# Patient Record
Sex: Male | Born: 1960 | Race: Black or African American | Hispanic: No | Marital: Single
Health system: Southern US, Community
[De-identification: ages and names within clinical notes are randomized; demographics above are authoritative.]

---

## 2005-12-25 ENCOUNTER — Other Ambulatory Visit: Payer: Self-pay

## 2005-12-25 ENCOUNTER — Emergency Department: Payer: Self-pay | Admitting: Emergency Medicine

## 2012-02-21 ENCOUNTER — Emergency Department: Payer: Self-pay | Admitting: Emergency Medicine

## 2012-02-21 LAB — URINALYSIS, COMPLETE
Bilirubin,UR: NEGATIVE
Blood: NEGATIVE
Nitrite: NEGATIVE
RBC,UR: <1 /HPF (ref 0–5)
Specific Gravity: 1.004 (ref 1.003–1.030)
WBC UR: 1 /HPF (ref 0–5)

## 2012-02-21 LAB — COMPREHENSIVE METABOLIC PANEL
Alkaline Phosphatase: 124 U/L (ref 50–136)
BUN: 7 mg/dL (ref 7–18)
Chloride: 106 mmol/L (ref 98–107)
Creatinine: 0.77 mg/dL (ref 0.60–1.30)
EGFR (African American): >60
EGFR (Non-African Amer.): >60
Glucose: 94 mg/dL (ref 65–99)
Osmolality: 275 (ref 275–301)
Sodium: 139 mmol/L (ref 136–145)

## 2012-02-21 LAB — DRUG SCREEN, URINE
Amphetamines, Ur Screen: NEGATIVE (ref ?–1000)
Cannabinoid 50 Ng, Ur ~~LOC~~: NEGATIVE (ref ?–50)
Cocaine Metabolite,Ur ~~LOC~~: POSITIVE (ref ?–300)
Methadone, Ur Screen: NEGATIVE (ref ?–300)
Tricyclic, Ur Screen: NEGATIVE (ref ?–1000)

## 2012-02-21 LAB — TSH: Thyroid Stimulating Horm: 1.23 u[IU]/mL

## 2012-02-21 LAB — TROPONIN I: Troponin-I: <0.02 ng/mL

## 2012-02-21 LAB — CBC
HCT: 37.5 % — ABNORMAL LOW (ref 40.0–52.0)
HGB: 12.9 g/dL — ABNORMAL LOW (ref 13.0–18.0)
MCH: 35.6 pg — ABNORMAL HIGH (ref 26.0–34.0)
MCHC: 34.5 g/dL (ref 32.0–36.0)
Platelet: 179 10*3/uL (ref 150–440)
RDW: 13.7 % (ref 11.5–14.5)
WBC: 9.3 10*3/uL (ref 3.8–10.6)

## 2012-02-21 LAB — LIPASE, BLOOD: Lipase: 228 U/L (ref 73–393)

## 2012-02-21 LAB — ETHANOL
Ethanol %: 0.062 % (ref 0.000–0.080)
Ethanol: 62 mg/dL

## 2012-03-22 ENCOUNTER — Emergency Department: Payer: Self-pay | Admitting: Emergency Medicine

## 2012-03-22 LAB — TROPONIN I: Troponin-I: <0.02 ng/mL

## 2012-03-22 LAB — COMPREHENSIVE METABOLIC PANEL
Alkaline Phosphatase: 101 U/L (ref 50–136)
Bilirubin,Total: 0.7 mg/dL (ref 0.2–1.0)
Calcium, Total: 8.7 mg/dL (ref 8.5–10.1)
EGFR (African American): >60
Glucose: 95 mg/dL (ref 65–99)
Potassium: 4 mmol/L (ref 3.5–5.1)
SGOT(AST): 30 U/L (ref 15–37)
SGPT (ALT): 26 U/L (ref 12–78)
Sodium: 135 mmol/L — ABNORMAL LOW (ref 136–145)
Total Protein: 8.1 g/dL (ref 6.4–8.2)

## 2012-03-22 LAB — CBC
Platelet: 186 10*3/uL (ref 150–440)
WBC: 15.5 10*3/uL — ABNORMAL HIGH (ref 3.8–10.6)

## 2012-05-09 ENCOUNTER — Emergency Department: Payer: Self-pay | Admitting: Emergency Medicine

## 2012-05-09 LAB — COMPREHENSIVE METABOLIC PANEL
Alkaline Phosphatase: 93 U/L (ref 50–136)
Anion Gap: 4 — ABNORMAL LOW (ref 7–16)
Bilirubin,Total: 0.5 mg/dL (ref 0.2–1.0)
EGFR (African American): >60
EGFR (Non-African Amer.): >60
Glucose: 86 mg/dL (ref 65–99)
Osmolality: 271 (ref 275–301)
SGOT(AST): 66 U/L — ABNORMAL HIGH (ref 15–37)
SGPT (ALT): 56 U/L (ref 12–78)
Sodium: 137 mmol/L (ref 136–145)
Total Protein: 7.9 g/dL (ref 6.4–8.2)

## 2012-05-09 LAB — URINALYSIS, COMPLETE
Bilirubin,UR: NEGATIVE
Ketone: NEGATIVE
Leukocyte Esterase: NEGATIVE
Nitrite: NEGATIVE
Ph: 5 (ref 4.5–8.0)
RBC,UR: <1 /HPF (ref 0–5)
Squamous Epithelial: NONE SEEN
WBC UR: 1 /HPF (ref 0–5)

## 2012-05-09 LAB — DRUG SCREEN, URINE
Amphetamines, Ur Screen: NEGATIVE (ref ?–1000)
Methadone, Ur Screen: NEGATIVE (ref ?–300)
Opiate, Ur Screen: NEGATIVE (ref ?–300)
Tricyclic, Ur Screen: NEGATIVE (ref ?–1000)

## 2012-05-09 LAB — CBC
MCHC: 32.7 g/dL (ref 32.0–36.0)
MCV: 103 fL — ABNORMAL HIGH (ref 80–100)
Platelet: 206 10*3/uL (ref 150–440)
RBC: 3.88 10*6/uL — ABNORMAL LOW (ref 4.40–5.90)
RDW: 16.9 % — ABNORMAL HIGH (ref 11.5–14.5)
WBC: 9 10*3/uL (ref 3.8–10.6)

## 2012-05-09 LAB — ETHANOL: Ethanol: 5 mg/dL

## 2012-05-15 ENCOUNTER — Ambulatory Visit: Payer: Self-pay | Admitting: Internal Medicine

## 2012-05-29 ENCOUNTER — Inpatient Hospital Stay: Payer: Self-pay | Admitting: Internal Medicine

## 2012-05-29 LAB — CBC WITH DIFFERENTIAL/PLATELET
Basophil %: 0.8 %
Eosinophil #: 0.3 10*3/uL (ref 0.0–0.7)
Eosinophil %: 3.6 %
HCT: 37.8 % — ABNORMAL LOW (ref 40.0–52.0)
HGB: 12.6 g/dL — ABNORMAL LOW (ref 13.0–18.0)
Lymphocyte %: 17.8 %
Monocyte %: 9.9 %

## 2012-05-29 LAB — COMPREHENSIVE METABOLIC PANEL
Albumin: 3.4 g/dL (ref 3.4–5.0)
Alkaline Phosphatase: 91 U/L (ref 50–136)
BUN: 6 mg/dL — ABNORMAL LOW (ref 7–18)
Calcium, Total: 8.5 mg/dL (ref 8.5–10.1)
Co2: 30 mmol/L (ref 21–32)
Glucose: 88 mg/dL (ref 65–99)
Osmolality: 278 (ref 275–301)
Potassium: 4.3 mmol/L (ref 3.5–5.1)
SGPT (ALT): 23 U/L (ref 12–78)

## 2012-05-31 ENCOUNTER — Ambulatory Visit: Payer: Self-pay | Admitting: Internal Medicine

## 2012-06-01 LAB — PATHOLOGY REPORT

## 2012-06-07 ENCOUNTER — Ambulatory Visit: Payer: Self-pay | Admitting: Internal Medicine

## 2012-06-07 LAB — PROTIME-INR: Prothrombin Time: 14.5 secs (ref 11.5–14.7)

## 2012-06-07 LAB — PLATELET COUNT: Platelet: 289 10*3/uL (ref 150–440)

## 2012-06-07 LAB — APTT: Activated PTT: 37.7 secs — ABNORMAL HIGH (ref 23.6–35.9)

## 2012-06-13 ENCOUNTER — Ambulatory Visit: Payer: Self-pay | Admitting: Vascular Surgery

## 2012-06-14 ENCOUNTER — Ambulatory Visit: Payer: Self-pay | Admitting: Internal Medicine

## 2012-06-14 LAB — PATHOLOGY REPORT

## 2012-06-18 LAB — COMPREHENSIVE METABOLIC PANEL
Anion Gap: 13 (ref 7–16)
Bilirubin,Total: 0.5 mg/dL (ref 0.2–1.0)
Calcium, Total: 8.8 mg/dL (ref 8.5–10.1)
Co2: 24 mmol/L (ref 21–32)
EGFR (African American): >60
Glucose: 81 mg/dL (ref 65–99)
Osmolality: 280 (ref 275–301)
SGOT(AST): 29 U/L (ref 15–37)
SGPT (ALT): 21 U/L (ref 12–78)

## 2012-06-18 LAB — CBC CANCER CENTER
Basophil #: 0 x10 3/mm (ref 0.0–0.1)
Basophil %: 0.5 %
Eosinophil %: 1.5 %
Lymphocyte #: 1.7 x10 3/mm (ref 1.0–3.6)
Lymphocyte %: 17.4 %
MCHC: 32.9 g/dL (ref 32.0–36.0)
MCV: 100 fL (ref 80–100)
Monocyte %: 9.8 %
Neutrophil %: 70.8 %
Platelet: 208 x10 3/mm (ref 150–440)
RBC: 3.91 10*6/uL — ABNORMAL LOW (ref 4.40–5.90)
RDW: 15.4 % — ABNORMAL HIGH (ref 11.5–14.5)
WBC: 9.5 x10 3/mm (ref 3.8–10.6)

## 2012-06-19 LAB — CEA: CEA: 8.6 ng/mL — ABNORMAL HIGH (ref 0.0–4.7)

## 2012-06-19 LAB — CANCER ANTIGEN 19-9: CA 19-9: 21 U/mL (ref 0–35)

## 2012-06-25 LAB — BASIC METABOLIC PANEL
Anion Gap: 9 (ref 7–16)
BUN: 8 mg/dL (ref 7–18)
Co2: 27 mmol/L (ref 21–32)
Glucose: 100 mg/dL — ABNORMAL HIGH (ref 65–99)
Potassium: 3.9 mmol/L (ref 3.5–5.1)

## 2012-06-25 LAB — CBC CANCER CENTER
Basophil #: 0 x10 3/mm (ref 0.0–0.1)
Basophil %: 1.2 %
Eosinophil %: 2.4 %
HCT: 34.5 % — ABNORMAL LOW (ref 40.0–52.0)
HGB: 11.6 g/dL — ABNORMAL LOW (ref 13.0–18.0)
MCH: 33.6 pg (ref 26.0–34.0)
MCHC: 33.8 g/dL (ref 32.0–36.0)
MCV: 100 fL (ref 80–100)
Monocyte %: 2.3 %
Neutrophil %: 57.7 %
RBC: 3.47 10*6/uL — ABNORMAL LOW (ref 4.40–5.90)
RDW: 14.1 % (ref 11.5–14.5)
WBC: 3.7 x10 3/mm — ABNORMAL LOW (ref 3.8–10.6)

## 2012-07-10 LAB — CBC CANCER CENTER
Basophil %: 0.6 %
Eosinophil #: 0 x10 3/mm (ref 0.0–0.7)
Eosinophil %: 0 %
HCT: 32.8 % — ABNORMAL LOW (ref 40.0–52.0)
Lymphocyte #: 1.9 x10 3/mm (ref 1.0–3.6)
MCH: 32.8 pg (ref 26.0–34.0)
MCHC: 33.3 g/dL (ref 32.0–36.0)
Monocyte #: 1.9 x10 3/mm — ABNORMAL HIGH (ref 0.2–1.0)
Monocyte %: 11.3 %
Neutrophil #: 13.1 x10 3/mm — ABNORMAL HIGH (ref 1.4–6.5)
RBC: 3.33 10*6/uL — ABNORMAL LOW (ref 4.40–5.90)
RDW: 13.4 % (ref 11.5–14.5)
WBC: 16.9 x10 3/mm — ABNORMAL HIGH (ref 3.8–10.6)

## 2012-07-10 LAB — COMPREHENSIVE METABOLIC PANEL
Albumin: 2.6 g/dL — ABNORMAL LOW (ref 3.4–5.0)
Anion Gap: 10 (ref 7–16)
BUN: 11 mg/dL (ref 7–18)
Bilirubin,Total: 0.8 mg/dL (ref 0.2–1.0)
Calcium, Total: 8.6 mg/dL (ref 8.5–10.1)
Co2: 26 mmol/L (ref 21–32)
Creatinine: 1.1 mg/dL (ref 0.60–1.30)
Potassium: 4.3 mmol/L (ref 3.5–5.1)
SGOT(AST): 17 U/L (ref 15–37)
SGPT (ALT): 16 U/L (ref 12–78)
Sodium: 132 mmol/L — ABNORMAL LOW (ref 136–145)
Total Protein: 7.7 g/dL (ref 6.4–8.2)

## 2012-07-15 ENCOUNTER — Ambulatory Visit: Payer: Self-pay | Admitting: Internal Medicine

## 2012-07-15 LAB — CULTURE, BLOOD (SINGLE)

## 2012-07-17 ENCOUNTER — Inpatient Hospital Stay: Payer: Self-pay | Admitting: Internal Medicine

## 2012-07-17 LAB — CBC CANCER CENTER
Basophil #: 0.1 x10 3/mm (ref 0.0–0.1)
Basophil %: 0.4 %
HCT: 33.1 % — ABNORMAL LOW (ref 40.0–52.0)
HGB: 11.2 g/dL — ABNORMAL LOW (ref 13.0–18.0)
Lymphocyte %: 10.9 %
MCH: 32.8 pg (ref 26.0–34.0)
MCHC: 33.8 g/dL (ref 32.0–36.0)
MCV: 97 fL (ref 80–100)
Monocyte #: 2.1 x10 3/mm — ABNORMAL HIGH (ref 0.2–1.0)
Monocyte %: 10.2 %
Neutrophil #: 15.8 x10 3/mm — ABNORMAL HIGH (ref 1.4–6.5)
Platelet: 419 x10 3/mm (ref 150–440)
RBC: 3.41 10*6/uL — ABNORMAL LOW (ref 4.40–5.90)
WBC: 20.2 x10 3/mm — ABNORMAL HIGH (ref 3.8–10.6)

## 2012-07-17 LAB — BASIC METABOLIC PANEL
Anion Gap: 13 (ref 7–16)
Calcium, Total: 9 mg/dL (ref 8.5–10.1)
Chloride: 97 mmol/L — ABNORMAL LOW (ref 98–107)
Co2: 25 mmol/L (ref 21–32)
Creatinine: 1.24 mg/dL (ref 0.60–1.30)
EGFR (African American): >60
Osmolality: 269 (ref 275–301)
Sodium: 135 mmol/L — ABNORMAL LOW (ref 136–145)

## 2012-07-18 LAB — CBC WITH DIFFERENTIAL/PLATELET
Basophil #: 0.3 10*3/uL — ABNORMAL HIGH (ref 0.0–0.1)
Basophil %: 1.3 %
Eosinophil #: 0 10*3/uL (ref 0.0–0.7)
HCT: 28.6 % — ABNORMAL LOW (ref 40.0–52.0)
Lymphocyte #: 2.2 10*3/uL (ref 1.0–3.6)
Lymphocyte %: 9.9 %
MCHC: 33.7 g/dL (ref 32.0–36.0)
Monocyte #: 2 x10 3/mm — ABNORMAL HIGH (ref 0.2–1.0)
Monocyte %: 9.4 %
Neutrophil #: 17.2 10*3/uL — ABNORMAL HIGH (ref 1.4–6.5)
Platelet: 361 10*3/uL (ref 150–440)
RDW: 13.3 % (ref 11.5–14.5)

## 2012-07-18 LAB — BASIC METABOLIC PANEL
Chloride: 99 mmol/L (ref 98–107)
Co2: 24 mmol/L (ref 21–32)
Creatinine: 0.8 mg/dL (ref 0.60–1.30)
EGFR (African American): >60
EGFR (Non-African Amer.): >60
Glucose: 78 mg/dL (ref 65–99)
Potassium: 3.9 mmol/L (ref 3.5–5.1)

## 2012-07-19 LAB — MAGNESIUM: Magnesium: 1.4 mg/dL — ABNORMAL LOW

## 2012-07-19 LAB — CBC WITH DIFFERENTIAL/PLATELET
Eosinophil %: 0.1 %
HCT: 27 % — ABNORMAL LOW (ref 40.0–52.0)
HGB: 9.1 g/dL — ABNORMAL LOW (ref 13.0–18.0)
Lymphocyte #: 1.5 10*3/uL (ref 1.0–3.6)
Lymphocyte %: 8.3 %
MCH: 32.5 pg (ref 26.0–34.0)
MCHC: 33.7 g/dL (ref 32.0–36.0)
MCV: 96 fL (ref 80–100)
Neutrophil #: 14.9 10*3/uL — ABNORMAL HIGH (ref 1.4–6.5)
RBC: 2.8 10*6/uL — ABNORMAL LOW (ref 4.40–5.90)
RDW: 13.4 % (ref 11.5–14.5)
WBC: 18.3 10*3/uL — ABNORMAL HIGH (ref 3.8–10.6)

## 2012-07-19 LAB — BASIC METABOLIC PANEL
BUN: 7 mg/dL (ref 7–18)
Calcium, Total: 8.2 mg/dL — ABNORMAL LOW (ref 8.5–10.1)
Co2: 25 mmol/L (ref 21–32)
Creatinine: 0.85 mg/dL (ref 0.60–1.30)
EGFR (African American): >60
EGFR (Non-African Amer.): >60
Glucose: 94 mg/dL (ref 65–99)
Sodium: 131 mmol/L — ABNORMAL LOW (ref 136–145)

## 2012-07-19 LAB — URINE CULTURE

## 2012-07-19 LAB — PHOSPHORUS: Phosphorus: 3 mg/dL (ref 2.5–4.9)

## 2012-07-20 LAB — CBC WITH DIFFERENTIAL/PLATELET
Basophil #: 0.1 10*3/uL (ref 0.0–0.1)
Basophil %: 0.6 %
Eosinophil #: 0 10*3/uL (ref 0.0–0.7)
HGB: 8.8 g/dL — ABNORMAL LOW (ref 13.0–18.0)
Lymphocyte #: 1.4 10*3/uL (ref 1.0–3.6)
MCHC: 33.6 g/dL (ref 32.0–36.0)
MCV: 97 fL (ref 80–100)
Monocyte #: 1.3 x10 3/mm — ABNORMAL HIGH (ref 0.2–1.0)
Neutrophil %: 81.2 %
RBC: 2.68 10*6/uL — ABNORMAL LOW (ref 4.40–5.90)
RDW: 13.5 % (ref 11.5–14.5)
WBC: 14.8 10*3/uL — ABNORMAL HIGH (ref 3.8–10.6)

## 2012-07-20 LAB — BASIC METABOLIC PANEL
Anion Gap: 6 — ABNORMAL LOW (ref 7–16)
BUN: 5 mg/dL — ABNORMAL LOW (ref 7–18)
Calcium, Total: 8 mg/dL — ABNORMAL LOW (ref 8.5–10.1)
Chloride: 101 mmol/L (ref 98–107)
Creatinine: 0.54 mg/dL — ABNORMAL LOW (ref 0.60–1.30)
EGFR (African American): >60
Glucose: 97 mg/dL (ref 65–99)
Osmolality: 265 (ref 275–301)
Sodium: 134 mmol/L — ABNORMAL LOW (ref 136–145)

## 2012-07-20 LAB — PHOSPHORUS: Phosphorus: 3.3 mg/dL (ref 2.5–4.9)

## 2012-07-21 LAB — MAGNESIUM: Magnesium: 1.5 mg/dL — ABNORMAL LOW

## 2012-07-21 LAB — BASIC METABOLIC PANEL WITH GFR
Anion Gap: 7
BUN: 4 mg/dL — ABNORMAL LOW
Calcium, Total: 7.9 mg/dL — ABNORMAL LOW
Chloride: 102 mmol/L
Co2: 25 mmol/L
Creatinine: 0.62 mg/dL
EGFR (African American): >60
EGFR (Non-African Amer.): >60
Glucose: 99 mg/dL
Osmolality: 265
Potassium: 3.7 mmol/L
Sodium: 134 mmol/L — ABNORMAL LOW

## 2012-07-22 LAB — BASIC METABOLIC PANEL
BUN: 4 mg/dL — ABNORMAL LOW (ref 7–18)
Chloride: 104 mmol/L (ref 98–107)
Creatinine: 0.54 mg/dL — ABNORMAL LOW (ref 0.60–1.30)
EGFR (Non-African Amer.): >60
Glucose: 117 mg/dL — ABNORMAL HIGH (ref 65–99)
Osmolality: 270 (ref 275–301)

## 2012-07-23 LAB — CBC WITH DIFFERENTIAL/PLATELET
Basophil #: 0.1 10*3/uL (ref 0.0–0.1)
Basophil %: 0.6 %
Eosinophil %: 1.3 %
HCT: 25.9 % — ABNORMAL LOW (ref 40.0–52.0)
HGB: 8.6 g/dL — ABNORMAL LOW (ref 13.0–18.0)
Lymphocyte #: 1.7 10*3/uL (ref 1.0–3.6)
MCHC: 33 g/dL (ref 32.0–36.0)
Monocyte %: 10 %
Neutrophil #: 9.9 10*3/uL — ABNORMAL HIGH (ref 1.4–6.5)
Platelet: 417 10*3/uL (ref 150–440)
WBC: 13.1 10*3/uL — ABNORMAL HIGH (ref 3.8–10.6)

## 2012-07-23 LAB — COMPREHENSIVE METABOLIC PANEL
Albumin: 1.6 g/dL — ABNORMAL LOW (ref 3.4–5.0)
Alkaline Phosphatase: 89 U/L (ref 50–136)
Anion Gap: 5 — ABNORMAL LOW (ref 7–16)
BUN: 6 mg/dL — ABNORMAL LOW (ref 7–18)
Bilirubin,Total: 0.2 mg/dL (ref 0.2–1.0)
Calcium, Total: 7.9 mg/dL — ABNORMAL LOW (ref 8.5–10.1)
Creatinine: 0.56 mg/dL — ABNORMAL LOW (ref 0.60–1.30)
EGFR (African American): >60
Glucose: 116 mg/dL — ABNORMAL HIGH (ref 65–99)
Osmolality: 271 (ref 275–301)
SGOT(AST): 101 U/L — ABNORMAL HIGH (ref 15–37)
SGPT (ALT): 59 U/L (ref 12–78)
Sodium: 136 mmol/L (ref 136–145)
Total Protein: 6.1 g/dL — ABNORMAL LOW (ref 6.4–8.2)

## 2012-07-23 LAB — CULTURE, BLOOD (SINGLE)

## 2012-07-25 LAB — CBC WITH DIFFERENTIAL/PLATELET
Basophil #: 0.1 10*3/uL (ref 0.0–0.1)
Eosinophil #: 0.3 10*3/uL (ref 0.0–0.7)
HGB: 9 g/dL — ABNORMAL LOW (ref 13.0–18.0)
Lymphocyte #: 2.1 10*3/uL (ref 1.0–3.6)
Monocyte #: 1.2 x10 3/mm — ABNORMAL HIGH (ref 0.2–1.0)
Monocyte %: 11.6 %
Neutrophil #: 6.7 10*3/uL — ABNORMAL HIGH (ref 1.4–6.5)
Neutrophil %: 64.4 %
Platelet: 470 10*3/uL — ABNORMAL HIGH (ref 150–440)
RBC: 2.77 10*6/uL — ABNORMAL LOW (ref 4.40–5.90)
RDW: 13.9 % (ref 11.5–14.5)
WBC: 10.3 10*3/uL (ref 3.8–10.6)

## 2012-07-25 LAB — BASIC METABOLIC PANEL
Anion Gap: 5 — ABNORMAL LOW (ref 7–16)
Chloride: 104 mmol/L (ref 98–107)
Co2: 26 mmol/L (ref 21–32)
Creatinine: 0.71 mg/dL (ref 0.60–1.30)
EGFR (Non-African Amer.): >60
Glucose: 84 mg/dL (ref 65–99)
Osmolality: 267 (ref 275–301)
Potassium: 4.5 mmol/L (ref 3.5–5.1)
Sodium: 135 mmol/L — ABNORMAL LOW (ref 136–145)

## 2012-07-25 LAB — MAGNESIUM: Magnesium: 1.6 mg/dL — ABNORMAL LOW

## 2012-07-26 ENCOUNTER — Ambulatory Visit: Payer: Self-pay | Admitting: Internal Medicine

## 2012-08-14 ENCOUNTER — Ambulatory Visit: Payer: Self-pay | Admitting: Internal Medicine

## 2012-08-14 LAB — CBC CANCER CENTER
Basophil %: 0.9 %
Eosinophil #: 0.1 x10 3/mm (ref 0.0–0.7)
Eosinophil %: 1.5 %
Lymphocyte #: 2.5 x10 3/mm (ref 1.0–3.6)
Lymphocyte %: 35.2 %
MCH: 31.9 pg (ref 26.0–34.0)
MCHC: 33 g/dL (ref 32.0–36.0)
MCV: 97 fL (ref 80–100)
Monocyte #: 0.7 x10 3/mm (ref 0.2–1.0)
Monocyte %: 9.7 %
Neutrophil #: 3.7 x10 3/mm (ref 1.4–6.5)
Neutrophil %: 52.7 %
RDW: 16.2 % — ABNORMAL HIGH (ref 11.5–14.5)
WBC: 7 x10 3/mm (ref 3.8–10.6)

## 2012-08-14 LAB — COMPREHENSIVE METABOLIC PANEL
Albumin: 3.2 g/dL — ABNORMAL LOW (ref 3.4–5.0)
BUN: 17 mg/dL (ref 7–18)
Bilirubin,Total: 0.4 mg/dL (ref 0.2–1.0)
Chloride: 102 mmol/L (ref 98–107)
Co2: 31 mmol/L (ref 21–32)
Creatinine: 1.03 mg/dL (ref 0.60–1.30)
EGFR (African American): >60
Glucose: 89 mg/dL (ref 65–99)
Osmolality: 277 (ref 275–301)
Potassium: 4.4 mmol/L (ref 3.5–5.1)
SGOT(AST): 27 U/L (ref 15–37)
SGPT (ALT): 26 U/L (ref 12–78)
Sodium: 138 mmol/L (ref 136–145)
Total Protein: 8.4 g/dL — ABNORMAL HIGH (ref 6.4–8.2)

## 2012-08-14 LAB — MAGNESIUM: Magnesium: 2.1 mg/dL

## 2012-08-22 LAB — CBC CANCER CENTER
Eosinophil #: 0.1 x10 3/mm (ref 0.0–0.7)
Eosinophil %: 2.9 %
HCT: 29.7 % — ABNORMAL LOW (ref 40.0–52.0)
Lymphocyte #: 2.1 x10 3/mm (ref 1.0–3.6)
Lymphocyte %: 56.3 %
MCH: 33.1 pg (ref 26.0–34.0)
Monocyte %: 2.8 %
Neutrophil #: 1.4 x10 3/mm (ref 1.4–6.5)
Platelet: 171 x10 3/mm (ref 150–440)
RBC: 3.08 10*6/uL — ABNORMAL LOW (ref 4.40–5.90)
RDW: 15.6 % — ABNORMAL HIGH (ref 11.5–14.5)
WBC: 3.8 x10 3/mm (ref 3.8–10.6)

## 2012-08-22 LAB — BASIC METABOLIC PANEL
Calcium, Total: 8.9 mg/dL (ref 8.5–10.1)
Chloride: 105 mmol/L (ref 98–107)
EGFR (African American): >60
EGFR (Non-African Amer.): >60
Potassium: 3.5 mmol/L (ref 3.5–5.1)
Sodium: 141 mmol/L (ref 136–145)

## 2012-08-29 LAB — CBC CANCER CENTER
Basophil #: 0 x10 3/mm (ref 0.0–0.1)
Eosinophil #: 0.2 x10 3/mm (ref 0.0–0.7)
HGB: 8.9 g/dL — ABNORMAL LOW (ref 13.0–18.0)
Lymphocyte #: 2.2 x10 3/mm (ref 1.0–3.6)
Lymphocyte %: 63.3 %
MCHC: 34.3 g/dL (ref 32.0–36.0)
MCV: 96 fL (ref 80–100)
Monocyte #: 0.2 x10 3/mm (ref 0.2–1.0)
Monocyte %: 4.3 %
Neutrophil %: 25.7 %
Platelet: 187 x10 3/mm (ref 150–440)
RBC: 2.72 10*6/uL — ABNORMAL LOW (ref 4.40–5.90)
RDW: 15.3 % — ABNORMAL HIGH (ref 11.5–14.5)
WBC: 3.5 x10 3/mm — ABNORMAL LOW (ref 3.8–10.6)

## 2012-08-29 LAB — CREATININE, SERUM: Creatinine: 0.72 mg/dL (ref 0.60–1.30)

## 2012-09-12 LAB — CBC CANCER CENTER
Basophil #: 0.1 x10 3/mm (ref 0.0–0.1)
Basophil %: 2.5 %
Eosinophil #: 0 x10 3/mm (ref 0.0–0.7)
Eosinophil %: 0.6 %
HCT: 36.4 % — ABNORMAL LOW (ref 40.0–52.0)
HGB: 12 g/dL — ABNORMAL LOW (ref 13.0–18.0)
Lymphocyte #: 2.2 x10 3/mm (ref 1.0–3.6)
Lymphocyte %: 48.3 %
MCH: 32.7 pg (ref 26.0–34.0)
MCHC: 33 g/dL (ref 32.0–36.0)
MCV: 99 fL (ref 80–100)
Monocyte #: 0.4 x10 3/mm (ref 0.2–1.0)
Monocyte %: 8.7 %
Neutrophil #: 1.8 x10 3/mm (ref 1.4–6.5)
Neutrophil %: 39.9 %
Platelet: 171 x10 3/mm (ref 150–440)
RBC: 3.67 10*6/uL — ABNORMAL LOW (ref 4.40–5.90)
RDW: 17.7 % — ABNORMAL HIGH (ref 11.5–14.5)
WBC: 4.6 x10 3/mm (ref 3.8–10.6)

## 2012-09-12 LAB — BASIC METABOLIC PANEL
Anion Gap: 14 (ref 7–16)
Calcium, Total: 8.8 mg/dL (ref 8.5–10.1)
Chloride: 102 mmol/L (ref 98–107)
Creatinine: 1.12 mg/dL (ref 0.60–1.30)
EGFR (Non-African Amer.): >60
Glucose: 142 mg/dL — ABNORMAL HIGH (ref 65–99)
Potassium: 3.6 mmol/L (ref 3.5–5.1)
Sodium: 139 mmol/L (ref 136–145)

## 2012-09-12 LAB — HEPATIC FUNCTION PANEL A (ARMC)
Albumin: 3.5 g/dL (ref 3.4–5.0)
Alkaline Phosphatase: 101 U/L (ref 50–136)
Bilirubin, Direct: 0.4 mg/dL — ABNORMAL HIGH (ref 0.00–0.20)
Bilirubin,Total: 1.2 mg/dL — ABNORMAL HIGH (ref 0.2–1.0)
SGPT (ALT): 41 U/L (ref 12–78)
Total Protein: 7.9 g/dL (ref 6.4–8.2)

## 2012-09-14 ENCOUNTER — Ambulatory Visit: Payer: Self-pay | Admitting: Internal Medicine

## 2012-09-19 LAB — CBC CANCER CENTER
Basophil #: 0 x10 3/mm (ref 0.0–0.1)
Basophil %: 1.5 %
Eosinophil #: 0 x10 3/mm (ref 0.0–0.7)
Eosinophil %: 1.2 %
HCT: 30 % — ABNORMAL LOW (ref 40.0–52.0)
Lymphocyte #: 2 x10 3/mm (ref 1.0–3.6)
Lymphocyte %: 60.3 %
MCV: 98 fL (ref 80–100)
Monocyte #: 0.2 x10 3/mm (ref 0.2–1.0)
RDW: 18 % — ABNORMAL HIGH (ref 11.5–14.5)

## 2012-09-19 LAB — CREATININE, SERUM
Creatinine: 0.86 mg/dL (ref 0.60–1.30)
EGFR (African American): >60
EGFR (Non-African Amer.): >60

## 2012-09-26 LAB — HEPATIC FUNCTION PANEL A (ARMC)
Albumin: 3.1 g/dL — ABNORMAL LOW (ref 3.4–5.0)
SGOT(AST): 29 U/L (ref 15–37)
SGPT (ALT): 34 U/L (ref 12–78)
Total Protein: 6.8 g/dL (ref 6.4–8.2)

## 2012-09-26 LAB — BASIC METABOLIC PANEL
BUN: 4 mg/dL — ABNORMAL LOW (ref 7–18)
Calcium, Total: 8.4 mg/dL — ABNORMAL LOW (ref 8.5–10.1)
Chloride: 107 mmol/L (ref 98–107)
Co2: 25 mmol/L (ref 21–32)
Creatinine: 0.88 mg/dL (ref 0.60–1.30)
EGFR (African American): >60
EGFR (Non-African Amer.): >60
Osmolality: 276 (ref 275–301)

## 2012-09-26 LAB — CBC CANCER CENTER
Basophil #: 0.1 x10 3/mm (ref 0.0–0.1)
Basophil %: 1 %
Eosinophil #: 0.2 x10 3/mm (ref 0.0–0.7)
Eosinophil %: 3.2 %
HCT: 33.7 % — ABNORMAL LOW (ref 40.0–52.0)
HGB: 11.3 g/dL — ABNORMAL LOW (ref 13.0–18.0)
Lymphocyte #: 2 x10 3/mm (ref 1.0–3.6)
Lymphocyte %: 37 %
MCV: 99 fL (ref 80–100)
Monocyte #: 0.6 x10 3/mm (ref 0.2–1.0)
Monocyte %: 11.3 %
Neutrophil #: 2.6 x10 3/mm (ref 1.4–6.5)
RBC: 3.39 10*6/uL — ABNORMAL LOW (ref 4.40–5.90)

## 2012-10-03 LAB — CBC CANCER CENTER
Basophil #: 0.1 x10 3/mm (ref 0.0–0.1)
Eosinophil #: 0 x10 3/mm (ref 0.0–0.7)
Eosinophil %: 1 %
HCT: 32.2 % — ABNORMAL LOW (ref 40.0–52.0)
HGB: 10.7 g/dL — ABNORMAL LOW (ref 13.0–18.0)
Lymphocyte #: 1.7 x10 3/mm (ref 1.0–3.6)
MCV: 100 fL (ref 80–100)
Monocyte #: 0.1 x10 3/mm — ABNORMAL LOW (ref 0.2–1.0)
RDW: 18.5 % — ABNORMAL HIGH (ref 11.5–14.5)

## 2012-10-03 LAB — CREATININE, SERUM
Creatinine: 0.78 mg/dL (ref 0.60–1.30)
EGFR (African American): >60

## 2012-10-15 ENCOUNTER — Ambulatory Visit: Payer: Self-pay | Admitting: Internal Medicine

## 2012-10-24 LAB — CBC CANCER CENTER
Basophil #: 0 x10 3/mm (ref 0.0–0.1)
Basophil %: 0.4 %
Eosinophil #: 0 x10 3/mm (ref 0.0–0.7)
HGB: 11.6 g/dL — ABNORMAL LOW (ref 13.0–18.0)
Lymphocyte #: 2.3 x10 3/mm (ref 1.0–3.6)
MCHC: 33.2 g/dL (ref 32.0–36.0)
MCV: 101 fL — ABNORMAL HIGH (ref 80–100)
Monocyte %: 9 %
RBC: 3.48 10*6/uL — ABNORMAL LOW (ref 4.40–5.90)
WBC: 5.1 x10 3/mm (ref 3.8–10.6)

## 2012-10-24 LAB — BASIC METABOLIC PANEL
Anion Gap: 10 (ref 7–16)
Chloride: 106 mmol/L (ref 98–107)
Co2: 26 mmol/L (ref 21–32)
Creatinine: 0.81 mg/dL (ref 0.60–1.30)
EGFR (African American): >60
EGFR (Non-African Amer.): >60
Glucose: 78 mg/dL (ref 65–99)
Osmolality: 279 (ref 275–301)
Potassium: 4 mmol/L (ref 3.5–5.1)

## 2012-10-24 LAB — HEPATIC FUNCTION PANEL A (ARMC)
Albumin: 3.4 g/dL (ref 3.4–5.0)
Bilirubin, Direct: 0.2 mg/dL (ref 0.00–0.20)
Bilirubin,Total: 0.5 mg/dL (ref 0.2–1.0)
SGOT(AST): 62 U/L — ABNORMAL HIGH (ref 15–37)
Total Protein: 7.3 g/dL (ref 6.4–8.2)

## 2012-10-31 LAB — CBC CANCER CENTER
HCT: 32.7 % — ABNORMAL LOW (ref 40.0–52.0)
HGB: 10.9 g/dL — ABNORMAL LOW (ref 13.0–18.0)
Lymphocyte #: 1.8 x10 3/mm (ref 1.0–3.6)
MCH: 33.5 pg (ref 26.0–34.0)
MCHC: 33.2 g/dL (ref 32.0–36.0)
Monocyte #: 0.2 x10 3/mm (ref 0.2–1.0)
Platelet: 97 x10 3/mm — ABNORMAL LOW (ref 150–440)
RBC: 3.24 10*6/uL — ABNORMAL LOW (ref 4.40–5.90)
WBC: 3.6 x10 3/mm — ABNORMAL LOW (ref 3.8–10.6)

## 2012-10-31 LAB — CREATININE, SERUM
Creatinine: 0.78 mg/dL (ref 0.60–1.30)
EGFR (African American): >60
EGFR (Non-African Amer.): >60

## 2012-11-07 LAB — HEPATIC FUNCTION PANEL A (ARMC)
Albumin: 3.3 g/dL — ABNORMAL LOW (ref 3.4–5.0)
Alkaline Phosphatase: 98 U/L (ref 50–136)
SGPT (ALT): 27 U/L (ref 12–78)
Total Protein: 7.1 g/dL (ref 6.4–8.2)

## 2012-11-07 LAB — BASIC METABOLIC PANEL
BUN: 6 mg/dL — ABNORMAL LOW (ref 7–18)
Calcium, Total: 9.3 mg/dL (ref 8.5–10.1)
Chloride: 106 mmol/L (ref 98–107)
Co2: 25 mmol/L (ref 21–32)
Creatinine: 0.71 mg/dL (ref 0.60–1.30)
Glucose: 89 mg/dL (ref 65–99)
Osmolality: 276 (ref 275–301)

## 2012-11-07 LAB — CBC CANCER CENTER
Eosinophil #: 0.1 x10 3/mm (ref 0.0–0.7)
Eosinophil %: 2.4 %
HGB: 10.4 g/dL — ABNORMAL LOW (ref 13.0–18.0)
Lymphocyte #: 2 x10 3/mm (ref 1.0–3.6)
MCH: 33.3 pg (ref 26.0–34.0)
Monocyte #: 0.2 x10 3/mm (ref 0.2–1.0)
Monocyte %: 5 %
Platelet: 193 x10 3/mm (ref 150–440)
RBC: 3.11 10*6/uL — ABNORMAL LOW (ref 4.40–5.90)
WBC: 3.2 x10 3/mm — ABNORMAL LOW (ref 3.8–10.6)

## 2012-11-14 ENCOUNTER — Ambulatory Visit: Payer: Self-pay | Admitting: Internal Medicine

## 2012-11-21 LAB — CBC CANCER CENTER
Basophil #: 0 x10 3/mm (ref 0.0–0.1)
Basophil %: 0.6 %
Eosinophil %: 1.3 %
HCT: 33.7 % — ABNORMAL LOW (ref 40.0–52.0)
Lymphocyte %: 35.6 %
MCH: 34.7 pg — ABNORMAL HIGH (ref 26.0–34.0)
MCHC: 33.3 g/dL (ref 32.0–36.0)
MCV: 104 fL — ABNORMAL HIGH (ref 80–100)
Neutrophil #: 2.7 x10 3/mm (ref 1.4–6.5)
Neutrophil %: 51.7 %
Platelet: 105 x10 3/mm — ABNORMAL LOW (ref 150–440)
RBC: 3.23 10*6/uL — ABNORMAL LOW (ref 4.40–5.90)
RDW: 19.3 % — ABNORMAL HIGH (ref 11.5–14.5)

## 2012-11-21 LAB — BASIC METABOLIC PANEL
Co2: 27 mmol/L (ref 21–32)
Creatinine: 0.97 mg/dL (ref 0.60–1.30)
EGFR (Non-African Amer.): >60
Sodium: 138 mmol/L (ref 136–145)

## 2012-11-28 LAB — CBC CANCER CENTER
Basophil %: 0.6 %
Eosinophil #: 0.1 x10 3/mm (ref 0.0–0.7)
HCT: 31.7 % — ABNORMAL LOW (ref 40.0–52.0)
Lymphocyte #: 1.9 x10 3/mm (ref 1.0–3.6)
Lymphocyte %: 37.3 %
MCHC: 33.6 g/dL (ref 32.0–36.0)
MCV: 104 fL — ABNORMAL HIGH (ref 80–100)
Monocyte #: 0.2 x10 3/mm (ref 0.2–1.0)
Monocyte %: 3.7 %
Neutrophil #: 2.9 x10 3/mm (ref 1.4–6.5)
Neutrophil %: 57.1 %
Platelet: 132 x10 3/mm — ABNORMAL LOW (ref 150–440)

## 2012-11-28 LAB — CREATININE, SERUM: Creatinine: 0.88 mg/dL (ref 0.60–1.30)

## 2012-12-05 LAB — CBC CANCER CENTER
Basophil %: 0.9 %
Eosinophil #: 0 x10 3/mm (ref 0.0–0.7)
Eosinophil %: 0.9 %
HGB: 9.5 g/dL — ABNORMAL LOW (ref 13.0–18.0)
MCHC: 33.7 g/dL (ref 32.0–36.0)
Neutrophil #: 1.2 x10 3/mm — ABNORMAL LOW (ref 1.4–6.5)
Neutrophil %: 34 %
Platelet: 175 x10 3/mm (ref 150–440)
RBC: 2.7 10*6/uL — ABNORMAL LOW (ref 4.40–5.90)
RDW: 16.3 % — ABNORMAL HIGH (ref 11.5–14.5)
WBC: 3.5 x10 3/mm — ABNORMAL LOW (ref 3.8–10.6)

## 2012-12-05 LAB — BASIC METABOLIC PANEL
Chloride: 104 mmol/L (ref 98–107)
Glucose: 88 mg/dL (ref 65–99)
Sodium: 138 mmol/L (ref 136–145)

## 2012-12-05 LAB — HEPATIC FUNCTION PANEL A (ARMC)
Albumin: 3.4 g/dL (ref 3.4–5.0)
Bilirubin,Total: 0.3 mg/dL (ref 0.2–1.0)
SGOT(AST): 21 U/L (ref 15–37)
SGPT (ALT): 23 U/L (ref 12–78)
Total Protein: 7.2 g/dL (ref 6.4–8.2)

## 2012-12-15 ENCOUNTER — Ambulatory Visit: Payer: Self-pay | Admitting: Internal Medicine

## 2012-12-19 LAB — CBC CANCER CENTER
Basophil #: 0 x10 3/mm (ref 0.0–0.1)
Eosinophil #: 0 x10 3/mm (ref 0.0–0.7)
HCT: 30.8 % — ABNORMAL LOW (ref 40.0–52.0)
HGB: 10.1 g/dL — ABNORMAL LOW (ref 13.0–18.0)
Lymphocyte #: 1.4 x10 3/mm (ref 1.0–3.6)
Lymphocyte %: 39 %
MCHC: 32.8 g/dL (ref 32.0–36.0)
Monocyte #: 0.5 x10 3/mm (ref 0.2–1.0)
Monocyte %: 12.7 %
Neutrophil #: 1.7 x10 3/mm (ref 1.4–6.5)
Neutrophil %: 46.4 %

## 2012-12-19 LAB — CREATININE, SERUM: EGFR (African American): >60

## 2012-12-26 LAB — CBC CANCER CENTER
Basophil #: 0 x10 3/mm (ref 0.0–0.1)
Basophil %: 1 %
Eosinophil #: 0 x10 3/mm (ref 0.0–0.7)
HCT: 26.2 % — ABNORMAL LOW (ref 40.0–52.0)
HGB: 8.5 g/dL — ABNORMAL LOW (ref 13.0–18.0)
Lymphocyte #: 2.4 x10 3/mm (ref 1.0–3.6)
Lymphocyte %: 57 %
MCH: 35 pg — ABNORMAL HIGH (ref 26.0–34.0)
MCHC: 32.4 g/dL (ref 32.0–36.0)
MCV: 108 fL — ABNORMAL HIGH (ref 80–100)
Monocyte #: 0.2 x10 3/mm (ref 0.2–1.0)
Monocyte %: 3.8 %
Neutrophil #: 1.6 x10 3/mm (ref 1.4–6.5)
Platelet: 83 x10 3/mm — ABNORMAL LOW (ref 150–440)
RDW: 17.4 % — ABNORMAL HIGH (ref 11.5–14.5)
WBC: 4.1 x10 3/mm (ref 3.8–10.6)

## 2012-12-26 LAB — HEPATIC FUNCTION PANEL A (ARMC)
Albumin: 3.2 g/dL — ABNORMAL LOW (ref 3.4–5.0)
Bilirubin, Direct: 0.1 mg/dL (ref 0.00–0.20)
Bilirubin,Total: 0.3 mg/dL (ref 0.2–1.0)
SGOT(AST): 36 U/L (ref 15–37)
SGPT (ALT): 34 U/L (ref 12–78)
Total Protein: 6.8 g/dL (ref 6.4–8.2)

## 2012-12-26 LAB — BASIC METABOLIC PANEL
Anion Gap: 10 (ref 7–16)
BUN: 6 mg/dL — ABNORMAL LOW (ref 7–18)
Chloride: 103 mmol/L (ref 98–107)
Co2: 26 mmol/L (ref 21–32)
Glucose: 95 mg/dL (ref 65–99)

## 2013-01-02 LAB — CBC CANCER CENTER
Basophil #: 0 x10 3/mm (ref 0.0–0.1)
Eosinophil #: 0 x10 3/mm (ref 0.0–0.7)
Eosinophil %: 1.2 %
Lymphocyte #: 1.4 x10 3/mm (ref 1.0–3.6)
MCH: 35.5 pg — ABNORMAL HIGH (ref 26.0–34.0)
MCV: 107 fL — ABNORMAL HIGH (ref 80–100)
Monocyte %: 3.6 %
Neutrophil %: 18.4 %
Platelet: 99 x10 3/mm — ABNORMAL LOW (ref 150–440)
RBC: 2.2 10*6/uL — ABNORMAL LOW (ref 4.40–5.90)
RDW: 17.2 % — ABNORMAL HIGH (ref 11.5–14.5)
WBC: 1.8 x10 3/mm — CL (ref 3.8–10.6)

## 2013-01-02 LAB — CREATININE, SERUM
Creatinine: 0.81 mg/dL (ref 0.60–1.30)
EGFR (Non-African Amer.): >60

## 2013-01-14 ENCOUNTER — Ambulatory Visit: Payer: Self-pay | Admitting: Internal Medicine

## 2013-01-16 LAB — BASIC METABOLIC PANEL
Anion Gap: 6 — ABNORMAL LOW (ref 7–16)
Calcium, Total: 8.7 mg/dL (ref 8.5–10.1)
Chloride: 102 mmol/L (ref 98–107)
Co2: 28 mmol/L (ref 21–32)
EGFR (Non-African Amer.): >60
Sodium: 136 mmol/L (ref 136–145)

## 2013-01-16 LAB — CBC CANCER CENTER
Basophil #: 0 x10 3/mm (ref 0.0–0.1)
Basophil %: 0.7 %
Eosinophil %: 0.3 %
HGB: 10.7 g/dL — ABNORMAL LOW (ref 13.0–18.0)
Lymphocyte #: 1.9 x10 3/mm (ref 1.0–3.6)
Lymphocyte %: 37.1 %
MCH: 35.9 pg — ABNORMAL HIGH (ref 26.0–34.0)
MCHC: 33 g/dL (ref 32.0–36.0)
MCV: 109 fL — ABNORMAL HIGH (ref 80–100)
Monocyte #: 0.6 x10 3/mm (ref 0.2–1.0)
Neutrophil #: 2.6 x10 3/mm (ref 1.4–6.5)
Neutrophil %: 50.5 %
WBC: 5.1 x10 3/mm (ref 3.8–10.6)

## 2013-01-16 LAB — HEPATIC FUNCTION PANEL A (ARMC)
Albumin: 3.2 g/dL — ABNORMAL LOW (ref 3.4–5.0)
Bilirubin, Direct: 0.3 mg/dL — ABNORMAL HIGH (ref 0.00–0.20)
Bilirubin,Total: 0.8 mg/dL (ref 0.2–1.0)
SGOT(AST): 145 U/L — ABNORMAL HIGH (ref 15–37)
SGPT (ALT): 71 U/L (ref 12–78)
Total Protein: 7.2 g/dL (ref 6.4–8.2)

## 2013-01-23 LAB — CBC CANCER CENTER
Basophil #: 0 x10 3/mm (ref 0.0–0.1)
Basophil %: 0.6 %
Eosinophil #: 0 x10 3/mm (ref 0.0–0.7)
Eosinophil %: 0.5 %
HGB: 9.2 g/dL — ABNORMAL LOW (ref 13.0–18.0)
Lymphocyte #: 1.7 x10 3/mm (ref 1.0–3.6)
Lymphocyte %: 37.8 %
MCH: 35.4 pg — ABNORMAL HIGH (ref 26.0–34.0)
Monocyte #: 0.2 x10 3/mm (ref 0.2–1.0)
Monocyte %: 3.7 %
Neutrophil %: 57.4 %
RBC: 2.59 10*6/uL — ABNORMAL LOW (ref 4.40–5.90)
RDW: 18.9 % — ABNORMAL HIGH (ref 11.5–14.5)

## 2013-01-23 LAB — CREATININE, SERUM: EGFR (Non-African Amer.): >60

## 2013-01-25 LAB — URINE CULTURE

## 2013-01-28 LAB — CULTURE, BLOOD (SINGLE)

## 2013-02-14 ENCOUNTER — Ambulatory Visit: Payer: Self-pay | Admitting: Internal Medicine

## 2013-02-20 LAB — CBC CANCER CENTER
BASOS ABS: 0 x10 3/mm (ref 0.0–0.1)
Basophil %: 0.4 %
Eosinophil #: 0 x10 3/mm (ref 0.0–0.7)
Eosinophil %: 0.3 %
HCT: 33.9 % — AB (ref 40.0–52.0)
HGB: 10.9 g/dL — ABNORMAL LOW (ref 13.0–18.0)
Lymphocyte #: 2.5 x10 3/mm (ref 1.0–3.6)
Lymphocyte %: 25.7 %
MCH: 34.5 pg — AB (ref 26.0–34.0)
MCHC: 32.2 g/dL (ref 32.0–36.0)
MCV: 107 fL — ABNORMAL HIGH (ref 80–100)
Monocyte #: 0.7 x10 3/mm (ref 0.2–1.0)
Monocyte %: 7.3 %
NEUTROS PCT: 66.3 %
Neutrophil #: 6.4 x10 3/mm (ref 1.4–6.5)
Platelet: 167 x10 3/mm (ref 150–440)
RBC: 3.16 10*6/uL — ABNORMAL LOW (ref 4.40–5.90)
RDW: 19.8 % — AB (ref 11.5–14.5)
WBC: 9.7 x10 3/mm (ref 3.8–10.6)

## 2013-02-20 LAB — BASIC METABOLIC PANEL
ANION GAP: 10 (ref 7–16)
BUN: 11 mg/dL (ref 7–18)
CALCIUM: 10 mg/dL (ref 8.5–10.1)
Chloride: 101 mmol/L (ref 98–107)
Co2: 26 mmol/L (ref 21–32)
Creatinine: 1.12 mg/dL (ref 0.60–1.30)
EGFR (African American): >60
Glucose: 138 mg/dL — ABNORMAL HIGH (ref 65–99)
OSMOLALITY: 275 (ref 275–301)
Potassium: 3.3 mmol/L — ABNORMAL LOW (ref 3.5–5.1)
Sodium: 137 mmol/L (ref 136–145)

## 2013-02-20 LAB — HEPATIC FUNCTION PANEL A (ARMC)
ALK PHOS: 85 U/L
ALT: 16 U/L (ref 12–78)
Albumin: 3.2 g/dL — ABNORMAL LOW (ref 3.4–5.0)
BILIRUBIN TOTAL: 1.4 mg/dL — AB (ref 0.2–1.0)
Bilirubin, Direct: 0.5 mg/dL — ABNORMAL HIGH (ref 0.00–0.20)
SGOT(AST): 18 U/L (ref 15–37)
Total Protein: 7.8 g/dL (ref 6.4–8.2)

## 2013-02-20 LAB — FOLATE: Folic Acid: 5 ng/mL (ref 3.1–100.0)

## 2013-02-27 LAB — CBC CANCER CENTER
BASOS PCT: 0.8 %
Basophil #: 0 x10 3/mm (ref 0.0–0.1)
EOS PCT: 1.2 %
Eosinophil #: 0.1 x10 3/mm (ref 0.0–0.7)
HCT: 31.5 % — AB (ref 40.0–52.0)
HGB: 10.1 g/dL — AB (ref 13.0–18.0)
LYMPHS ABS: 3 x10 3/mm (ref 1.0–3.6)
LYMPHS PCT: 52.9 %
MCH: 34 pg (ref 26.0–34.0)
MCHC: 32 g/dL (ref 32.0–36.0)
MCV: 106 fL — ABNORMAL HIGH (ref 80–100)
Monocyte #: 0.2 x10 3/mm (ref 0.2–1.0)
Monocyte %: 3.3 %
NEUTROS ABS: 2.3 x10 3/mm (ref 1.4–6.5)
NEUTROS PCT: 41.8 %
PLATELETS: 189 x10 3/mm (ref 150–440)
RBC: 2.96 10*6/uL — ABNORMAL LOW (ref 4.40–5.90)
RDW: 18.8 % — ABNORMAL HIGH (ref 11.5–14.5)
WBC: 5.6 x10 3/mm (ref 3.8–10.6)

## 2013-02-27 LAB — CREATININE, SERUM: Creatinine: 0.91 mg/dL (ref 0.60–1.30)

## 2013-03-06 LAB — BASIC METABOLIC PANEL
Anion Gap: 8 (ref 7–16)
BUN: 6 mg/dL — ABNORMAL LOW (ref 7–18)
CALCIUM: 7.9 mg/dL — AB (ref 8.5–10.1)
CREATININE: 0.9 mg/dL (ref 0.60–1.30)
Chloride: 100 mmol/L (ref 98–107)
Co2: 29 mmol/L (ref 21–32)
EGFR (African American): >60
EGFR (Non-African Amer.): >60
Glucose: 103 mg/dL — ABNORMAL HIGH (ref 65–99)
OSMOLALITY: 272 (ref 275–301)
Potassium: 3.4 mmol/L — ABNORMAL LOW (ref 3.5–5.1)
SODIUM: 137 mmol/L (ref 136–145)

## 2013-03-06 LAB — CBC CANCER CENTER
Basophil #: 0 x10 3/mm (ref 0.0–0.1)
Basophil %: 1.1 %
EOS ABS: 0 x10 3/mm (ref 0.0–0.7)
EOS PCT: 1.3 %
HCT: 27.2 % — AB (ref 40.0–52.0)
HGB: 8.7 g/dL — ABNORMAL LOW (ref 13.0–18.0)
LYMPHS ABS: 1.7 x10 3/mm (ref 1.0–3.6)
Lymphocyte %: 62 %
MCH: 33.8 pg (ref 26.0–34.0)
MCHC: 32.2 g/dL (ref 32.0–36.0)
MCV: 105 fL — ABNORMAL HIGH (ref 80–100)
Monocyte #: 0.2 x10 3/mm (ref 0.2–1.0)
Monocyte %: 7.9 %
NEUTROS ABS: 0.8 x10 3/mm — AB (ref 1.4–6.5)
Neutrophil %: 27.7 %
Platelet: 181 x10 3/mm (ref 150–440)
RBC: 2.59 10*6/uL — AB (ref 4.40–5.90)
RDW: 18.8 % — AB (ref 11.5–14.5)
WBC: 2.7 x10 3/mm — AB (ref 3.8–10.6)

## 2013-03-06 LAB — HEPATIC FUNCTION PANEL A (ARMC)
ALBUMIN: 2.8 g/dL — AB (ref 3.4–5.0)
ALK PHOS: 69 U/L
AST: 14 U/L — AB (ref 15–37)
Bilirubin, Direct: 0.1 mg/dL (ref 0.00–0.20)
Bilirubin,Total: 0.2 mg/dL (ref 0.2–1.0)
SGPT (ALT): 14 U/L (ref 12–78)
Total Protein: 7 g/dL (ref 6.4–8.2)

## 2013-03-13 LAB — CBC CANCER CENTER
BASOS ABS: 0.1 x10 3/mm (ref 0.0–0.1)
Basophil %: 1.3 %
EOS ABS: 0.1 x10 3/mm (ref 0.0–0.7)
EOS PCT: 1.5 %
HCT: 28.7 % — ABNORMAL LOW (ref 40.0–52.0)
HGB: 9.3 g/dL — ABNORMAL LOW (ref 13.0–18.0)
LYMPHS ABS: 1.5 x10 3/mm (ref 1.0–3.6)
Lymphocyte %: 37.8 %
MCH: 33.7 pg (ref 26.0–34.0)
MCHC: 32.4 g/dL (ref 32.0–36.0)
MCV: 104 fL — ABNORMAL HIGH (ref 80–100)
MONO ABS: 0.4 x10 3/mm (ref 0.2–1.0)
Monocyte %: 11.2 %
NEUTROS ABS: 1.9 x10 3/mm (ref 1.4–6.5)
NEUTROS PCT: 48.2 %
Platelet: 196 x10 3/mm (ref 150–440)
RBC: 2.76 10*6/uL — ABNORMAL LOW (ref 4.40–5.90)
RDW: 18 % — ABNORMAL HIGH (ref 11.5–14.5)
WBC: 3.9 x10 3/mm (ref 3.8–10.6)

## 2013-03-13 LAB — BASIC METABOLIC PANEL
ANION GAP: 8 (ref 7–16)
BUN: 8 mg/dL (ref 7–18)
CHLORIDE: 103 mmol/L (ref 98–107)
CREATININE: 0.88 mg/dL (ref 0.60–1.30)
Calcium, Total: 7.5 mg/dL — ABNORMAL LOW (ref 8.5–10.1)
Co2: 27 mmol/L (ref 21–32)
EGFR (African American): >60
Glucose: 116 mg/dL — ABNORMAL HIGH (ref 65–99)
OSMOLALITY: 275 (ref 275–301)
Potassium: 3.4 mmol/L — ABNORMAL LOW (ref 3.5–5.1)
Sodium: 138 mmol/L (ref 136–145)

## 2013-03-17 ENCOUNTER — Ambulatory Visit: Payer: Self-pay | Admitting: Internal Medicine

## 2013-03-20 LAB — CBC CANCER CENTER
BASOS ABS: 0 x10 3/mm (ref 0.0–0.1)
Basophil %: 0.7 %
EOS PCT: 0.5 %
Eosinophil #: 0 x10 3/mm (ref 0.0–0.7)
HCT: 27.9 % — ABNORMAL LOW (ref 40.0–52.0)
HGB: 9 g/dL — AB (ref 13.0–18.0)
LYMPHS PCT: 32.6 %
Lymphocyte #: 2.2 x10 3/mm (ref 1.0–3.6)
MCH: 33.4 pg (ref 26.0–34.0)
MCHC: 32.3 g/dL (ref 32.0–36.0)
MCV: 103 fL — AB (ref 80–100)
MONO ABS: 0.1 x10 3/mm — AB (ref 0.2–1.0)
Monocyte %: 2.2 %
NEUTROS PCT: 64 %
Neutrophil #: 4.2 x10 3/mm (ref 1.4–6.5)
Platelet: 132 x10 3/mm — ABNORMAL LOW (ref 150–440)
RBC: 2.7 10*6/uL — ABNORMAL LOW (ref 4.40–5.90)
RDW: 17.7 % — ABNORMAL HIGH (ref 11.5–14.5)
WBC: 6.6 x10 3/mm (ref 3.8–10.6)

## 2013-03-20 LAB — CREATININE, SERUM
Creatinine: 0.73 mg/dL (ref 0.60–1.30)
EGFR (African American): >60

## 2013-03-27 LAB — CBC CANCER CENTER
BASOS ABS: 0 x10 3/mm (ref 0.0–0.1)
BASOS PCT: 0.7 %
Eosinophil #: 0 x10 3/mm (ref 0.0–0.7)
Eosinophil %: 0.8 %
HCT: 25.6 % — ABNORMAL LOW (ref 40.0–52.0)
HGB: 8.3 g/dL — AB (ref 13.0–18.0)
LYMPHS ABS: 1.4 x10 3/mm (ref 1.0–3.6)
LYMPHS PCT: 53.5 %
MCH: 33 pg (ref 26.0–34.0)
MCHC: 32.5 g/dL (ref 32.0–36.0)
MCV: 102 fL — AB (ref 80–100)
MONO ABS: 0.1 x10 3/mm — AB (ref 0.2–1.0)
Monocyte %: 5.2 %
Neutrophil #: 1.1 x10 3/mm — ABNORMAL LOW (ref 1.4–6.5)
Neutrophil %: 39.8 %
Platelet: 144 x10 3/mm — ABNORMAL LOW (ref 150–440)
RBC: 2.52 10*6/uL — ABNORMAL LOW (ref 4.40–5.90)
RDW: 17.2 % — ABNORMAL HIGH (ref 11.5–14.5)
WBC: 2.7 x10 3/mm — ABNORMAL LOW (ref 3.8–10.6)

## 2013-03-27 LAB — URINALYSIS, COMPLETE
Bacteria: NONE SEEN
Bilirubin,UR: NEGATIVE
Blood: NEGATIVE
GLUCOSE, UR: NEGATIVE mg/dL (ref 0–75)
KETONE: NEGATIVE
LEUKOCYTE ESTERASE: NEGATIVE
Nitrite: NEGATIVE
PH: 7 (ref 4.5–8.0)
Protein: 30
RBC,UR: <1 /HPF (ref 0–5)
SPECIFIC GRAVITY: 1.024 (ref 1.003–1.030)

## 2013-03-27 LAB — BASIC METABOLIC PANEL
Anion Gap: 9 (ref 7–16)
BUN: 4 mg/dL — AB (ref 7–18)
CHLORIDE: 102 mmol/L (ref 98–107)
CREATININE: 0.81 mg/dL (ref 0.60–1.30)
Calcium, Total: 7.5 mg/dL — ABNORMAL LOW (ref 8.5–10.1)
Co2: 27 mmol/L (ref 21–32)
EGFR (African American): >60
EGFR (Non-African Amer.): >60
Glucose: 114 mg/dL — ABNORMAL HIGH (ref 65–99)
Osmolality: 273 (ref 275–301)
Potassium: 3.9 mmol/L (ref 3.5–5.1)
SODIUM: 138 mmol/L (ref 136–145)

## 2013-03-27 LAB — HEPATIC FUNCTION PANEL A (ARMC)
Albumin: 2.7 g/dL — ABNORMAL LOW (ref 3.4–5.0)
Alkaline Phosphatase: 59 U/L
Bilirubin, Direct: 0.2 mg/dL (ref 0.00–0.20)
Bilirubin,Total: 0.4 mg/dL (ref 0.2–1.0)
SGOT(AST): 13 U/L — ABNORMAL LOW (ref 15–37)
SGPT (ALT): 13 U/L (ref 12–78)
Total Protein: 7.3 g/dL (ref 6.4–8.2)

## 2013-03-29 LAB — URINE CULTURE

## 2013-04-01 LAB — CBC CANCER CENTER
BASOS ABS: 0 x10 3/mm (ref 0.0–0.1)
BASOS PCT: 0.5 %
Eosinophil #: 0 x10 3/mm (ref 0.0–0.7)
Eosinophil %: 0.5 %
HCT: 25.5 % — ABNORMAL LOW (ref 40.0–52.0)
HGB: 8.2 g/dL — ABNORMAL LOW (ref 13.0–18.0)
LYMPHS ABS: 1.8 x10 3/mm (ref 1.0–3.6)
LYMPHS PCT: 47.3 %
MCH: 32.3 pg (ref 26.0–34.0)
MCHC: 31.9 g/dL — ABNORMAL LOW (ref 32.0–36.0)
MCV: 101 fL — ABNORMAL HIGH (ref 80–100)
MONO ABS: 0.7 x10 3/mm (ref 0.2–1.0)
Monocyte %: 19.5 %
Neutrophil #: 1.2 x10 3/mm — ABNORMAL LOW (ref 1.4–6.5)
Neutrophil %: 32.2 %
PLATELETS: 258 x10 3/mm (ref 150–440)
RBC: 2.52 10*6/uL — AB (ref 4.40–5.90)
RDW: 17.4 % — AB (ref 11.5–14.5)
WBC: 3.8 x10 3/mm (ref 3.8–10.6)

## 2013-04-01 LAB — BASIC METABOLIC PANEL
Anion Gap: 9 (ref 7–16)
BUN: 3 mg/dL — AB (ref 7–18)
CHLORIDE: 102 mmol/L (ref 98–107)
Calcium, Total: 7.2 mg/dL — ABNORMAL LOW (ref 8.5–10.1)
Co2: 29 mmol/L (ref 21–32)
Creatinine: 0.71 mg/dL (ref 0.60–1.30)
EGFR (African American): >60
GLUCOSE: 108 mg/dL — AB (ref 65–99)
OSMOLALITY: 276 (ref 275–301)
Potassium: 2.8 mmol/L — ABNORMAL LOW (ref 3.5–5.1)
SODIUM: 140 mmol/L (ref 136–145)

## 2013-04-01 LAB — CULTURE, BLOOD (SINGLE)

## 2013-04-08 LAB — CBC CANCER CENTER
BASOS ABS: 0 x10 3/mm (ref 0.0–0.1)
BASOS PCT: 0.5 %
Eosinophil #: 0 x10 3/mm (ref 0.0–0.7)
Eosinophil %: 0.2 %
HCT: 29.8 % — AB (ref 40.0–52.0)
HGB: 9.5 g/dL — ABNORMAL LOW (ref 13.0–18.0)
Lymphocyte #: 2.6 x10 3/mm (ref 1.0–3.6)
Lymphocyte %: 38.4 %
MCH: 32.1 pg (ref 26.0–34.0)
MCHC: 31.8 g/dL — ABNORMAL LOW (ref 32.0–36.0)
MCV: 101 fL — ABNORMAL HIGH (ref 80–100)
MONO ABS: 0.9 x10 3/mm (ref 0.2–1.0)
Monocyte %: 13.3 %
Neutrophil #: 3.2 x10 3/mm (ref 1.4–6.5)
Neutrophil %: 47.6 %
Platelet: 213 x10 3/mm (ref 150–440)
RBC: 2.96 10*6/uL — ABNORMAL LOW (ref 4.40–5.90)
RDW: 18.1 % — ABNORMAL HIGH (ref 11.5–14.5)
WBC: 6.8 x10 3/mm (ref 3.8–10.6)

## 2013-04-08 LAB — BASIC METABOLIC PANEL
ANION GAP: 9 (ref 7–16)
BUN: 5 mg/dL — AB (ref 7–18)
CHLORIDE: 101 mmol/L (ref 98–107)
CREATININE: 0.87 mg/dL (ref 0.60–1.30)
Calcium, Total: 7.4 mg/dL — ABNORMAL LOW (ref 8.5–10.1)
Co2: 31 mmol/L (ref 21–32)
EGFR (African American): >60
Glucose: 103 mg/dL — ABNORMAL HIGH (ref 65–99)
OSMOLALITY: 279 (ref 275–301)
Potassium: 2.9 mmol/L — ABNORMAL LOW (ref 3.5–5.1)
Sodium: 141 mmol/L (ref 136–145)

## 2013-04-10 LAB — CBC CANCER CENTER
Basophil #: 0 x10 3/mm (ref 0.0–0.1)
Basophil %: 0.5 %
EOS ABS: 0 x10 3/mm (ref 0.0–0.7)
Eosinophil %: 0.1 %
HCT: 25.9 % — ABNORMAL LOW (ref 40.0–52.0)
HGB: 8.2 g/dL — ABNORMAL LOW (ref 13.0–18.0)
LYMPHS ABS: 1.8 x10 3/mm (ref 1.0–3.6)
Lymphocyte %: 25.5 %
MCH: 32.1 pg (ref 26.0–34.0)
MCHC: 31.8 g/dL — ABNORMAL LOW (ref 32.0–36.0)
MCV: 101 fL — ABNORMAL HIGH (ref 80–100)
MONOS PCT: 7.5 %
Monocyte #: 0.5 x10 3/mm (ref 0.2–1.0)
Neutrophil #: 4.8 x10 3/mm (ref 1.4–6.5)
Neutrophil %: 66.4 %
Platelet: 149 x10 3/mm — ABNORMAL LOW (ref 150–440)
RBC: 2.57 10*6/uL — ABNORMAL LOW (ref 4.40–5.90)
RDW: 18.7 % — AB (ref 11.5–14.5)
WBC: 7.2 x10 3/mm (ref 3.8–10.6)

## 2013-04-10 LAB — BASIC METABOLIC PANEL
ANION GAP: 7 (ref 7–16)
BUN: 2 mg/dL — ABNORMAL LOW (ref 7–18)
CALCIUM: 6.9 mg/dL — AB (ref 8.5–10.1)
CO2: 27 mmol/L (ref 21–32)
CREATININE: 0.78 mg/dL (ref 0.60–1.30)
Chloride: 106 mmol/L (ref 98–107)
GLUCOSE: 119 mg/dL — AB (ref 65–99)
Osmolality: 277 (ref 275–301)
Potassium: 3.2 mmol/L — ABNORMAL LOW (ref 3.5–5.1)
Sodium: 140 mmol/L (ref 136–145)

## 2013-04-14 ENCOUNTER — Ambulatory Visit: Payer: Self-pay | Admitting: Internal Medicine

## 2013-04-23 LAB — CBC CANCER CENTER
Basophil #: 0 x10 3/mm (ref 0.0–0.1)
Basophil %: 0.7 %
EOS ABS: 0.1 x10 3/mm (ref 0.0–0.7)
EOS PCT: 0.8 %
HCT: 32.8 % — ABNORMAL LOW (ref 40.0–52.0)
HGB: 10.6 g/dL — ABNORMAL LOW (ref 13.0–18.0)
LYMPHS ABS: 2.8 x10 3/mm (ref 1.0–3.6)
Lymphocyte %: 39.8 %
MCH: 32.4 pg (ref 26.0–34.0)
MCHC: 32.3 g/dL (ref 32.0–36.0)
MCV: 100 fL (ref 80–100)
MONO ABS: 0.7 x10 3/mm (ref 0.2–1.0)
MONOS PCT: 9.9 %
Neutrophil #: 3.4 x10 3/mm (ref 1.4–6.5)
Neutrophil %: 48.8 %
PLATELETS: 274 x10 3/mm (ref 150–440)
RBC: 3.28 10*6/uL — ABNORMAL LOW (ref 4.40–5.90)
RDW: 17.6 % — AB (ref 11.5–14.5)
WBC: 6.9 x10 3/mm (ref 3.8–10.6)

## 2013-04-23 LAB — BASIC METABOLIC PANEL
ANION GAP: 7 (ref 7–16)
BUN: 9 mg/dL (ref 7–18)
CALCIUM: 8.6 mg/dL (ref 8.5–10.1)
CHLORIDE: 104 mmol/L (ref 98–107)
CREATININE: 0.86 mg/dL (ref 0.60–1.30)
Co2: 29 mmol/L (ref 21–32)
Glucose: 93 mg/dL (ref 65–99)
Osmolality: 278 (ref 275–301)
Potassium: 3 mmol/L — ABNORMAL LOW (ref 3.5–5.1)
Sodium: 140 mmol/L (ref 136–145)

## 2013-05-07 ENCOUNTER — Inpatient Hospital Stay: Payer: Self-pay | Admitting: Internal Medicine

## 2013-05-07 LAB — COMPREHENSIVE METABOLIC PANEL
ANION GAP: 8 (ref 7–16)
Albumin: 2.5 g/dL — ABNORMAL LOW (ref 3.4–5.0)
Alkaline Phosphatase: 112 U/L
BUN: 7 mg/dL (ref 7–18)
Bilirubin,Total: 1.2 mg/dL — ABNORMAL HIGH (ref 0.2–1.0)
CALCIUM: 8.7 mg/dL (ref 8.5–10.1)
CHLORIDE: 106 mmol/L (ref 98–107)
CO2: 25 mmol/L (ref 21–32)
Creatinine: 0.99 mg/dL (ref 0.60–1.30)
EGFR (African American): >60
Glucose: 119 mg/dL — ABNORMAL HIGH (ref 65–99)
Osmolality: 277 (ref 275–301)
POTASSIUM: 4.4 mmol/L (ref 3.5–5.1)
SGOT(AST): 30 U/L (ref 15–37)
SGPT (ALT): 22 U/L (ref 12–78)
SODIUM: 139 mmol/L (ref 136–145)
Total Protein: 7.5 g/dL (ref 6.4–8.2)

## 2013-05-07 LAB — CBC WITH DIFFERENTIAL/PLATELET
BASOS PCT: 0.7 %
Basophil #: 0.1 10*3/uL (ref 0.0–0.1)
EOS PCT: 0.3 %
Eosinophil #: 0 10*3/uL (ref 0.0–0.7)
HCT: 36.6 % — AB (ref 40.0–52.0)
HGB: 11.7 g/dL — ABNORMAL LOW (ref 13.0–18.0)
Lymphocyte #: 2.3 10*3/uL (ref 1.0–3.6)
Lymphocyte %: 22.5 %
MCH: 32.7 pg (ref 26.0–34.0)
MCHC: 32 g/dL (ref 32.0–36.0)
MCV: 102 fL — ABNORMAL HIGH (ref 80–100)
MONO ABS: 0.7 x10 3/mm (ref 0.2–1.0)
MONOS PCT: 7 %
Neutrophil #: 7.2 10*3/uL — ABNORMAL HIGH (ref 1.4–6.5)
Neutrophil %: 69.5 %
Platelet: 202 10*3/uL (ref 150–440)
RBC: 3.58 10*6/uL — ABNORMAL LOW (ref 4.40–5.90)
RDW: 16.8 % — AB (ref 11.5–14.5)
WBC: 10.3 10*3/uL (ref 3.8–10.6)

## 2013-05-07 LAB — URINALYSIS, COMPLETE
BLOOD: NEGATIVE
Bilirubin,UR: NEGATIVE
GLUCOSE, UR: NEGATIVE mg/dL (ref 0–75)
Hyaline Cast: 15
Ketone: NEGATIVE
LEUKOCYTE ESTERASE: NEGATIVE
Nitrite: NEGATIVE
Ph: 5 (ref 4.5–8.0)
RBC,UR: 4 /HPF (ref 0–5)
Specific Gravity: 1.042 (ref 1.003–1.030)
Squamous Epithelial: <1
WBC UR: 3 /HPF (ref 0–5)

## 2013-05-07 LAB — TROPONIN I: Troponin-I: <0.02 ng/mL

## 2013-05-08 ENCOUNTER — Ambulatory Visit: Payer: Self-pay | Admitting: Neurology

## 2013-05-08 LAB — BASIC METABOLIC PANEL
Anion Gap: 6 — ABNORMAL LOW (ref 7–16)
BUN: 7 mg/dL (ref 7–18)
CALCIUM: 8.1 mg/dL — AB (ref 8.5–10.1)
CO2: 27 mmol/L (ref 21–32)
CREATININE: 0.83 mg/dL (ref 0.60–1.30)
Chloride: 106 mmol/L (ref 98–107)
EGFR (Non-African Amer.): >60
GLUCOSE: 121 mg/dL — AB (ref 65–99)
OSMOLALITY: 277 (ref 275–301)
Potassium: 4.2 mmol/L (ref 3.5–5.1)
SODIUM: 139 mmol/L (ref 136–145)

## 2013-05-08 LAB — CBC WITH DIFFERENTIAL/PLATELET
BASOS PCT: 0.2 %
Basophil #: 0 10*3/uL (ref 0.0–0.1)
Eosinophil #: 0 10*3/uL (ref 0.0–0.7)
Eosinophil %: 0 %
HCT: 29.9 % — ABNORMAL LOW (ref 40.0–52.0)
HGB: 9.7 g/dL — ABNORMAL LOW (ref 13.0–18.0)
LYMPHS ABS: 0.6 10*3/uL — AB (ref 1.0–3.6)
Lymphocyte %: 4.6 %
MCH: 32.4 pg (ref 26.0–34.0)
MCHC: 32.3 g/dL (ref 32.0–36.0)
MCV: 100 fL (ref 80–100)
Monocyte #: 0.2 x10 3/mm (ref 0.2–1.0)
Monocyte %: 1.3 %
Neutrophil #: 13.2 10*3/uL — ABNORMAL HIGH (ref 1.4–6.5)
Neutrophil %: 93.9 %
PLATELETS: 191 10*3/uL (ref 150–440)
RBC: 2.98 10*6/uL — ABNORMAL LOW (ref 4.40–5.90)
RDW: 16.6 % — ABNORMAL HIGH (ref 11.5–14.5)
WBC: 14 10*3/uL — AB (ref 3.8–10.6)

## 2013-05-15 ENCOUNTER — Ambulatory Visit: Payer: Self-pay | Admitting: Internal Medicine

## 2013-05-15 DEATH — deceased

## 2014-06-06 NOTE — Consult Note (Signed)
PATIENT NAME:  Jared Valdez, Lamoine MR#:  [id number] DATE OF BIRTH:  17-Nov-1960  DATE OF CONSULTATION:  05/31/2012  REFERRING PHYSICIAN:  Dr. Imogene Burnhen.  CONSULTING PHYSICIAN:  Lakiah Dhingra R. Sherrlyn HockPandit, MD  REASON FOR CONSULTATION: Esophageal cancer.   HISTORY OF PRESENT ILLNESS: The patient is a 54 year old African American gentleman with history of asthma, depression, substance abuse, alcohol abuse and smoking who presented to Emergency Room with progressive dysphagia. The patient also feels that he may have lost a few pounds in the last couple of months. He has had some generalized weakness and fatigue on exertion. He has had evaluation by GI. An EGD done on April 16th did show partially obstructing likely malignant esophageal tumor in the middle third of the esophagus; biopsy done and pathology report pending at this time. CT scan of the chest and abdomen done earlier today shows circumferential thickening of the esophageal wall at the level of the carina concerning for malignancy, 3 mm nodule in the right posterior costophrenic angle, 6 mm irregular nodule in the anterior left upper lobe, 14 mm nodule in the posterior left upper lobe, borderline enlarged right paratracheal lymph node and enlarged left hilar lymph node measuring 12 mm in diameter; findings raising concern for malignancy. The patient currently states that he is able to swallow liquids. Denies any pain issues. Denies any hematemesis or blood in stools.   PAST MEDICAL HISTORY AND SURGICAL HISTORY: As in HPI above.   FAMILY HISTORY: Noncontributory.   SOCIAL HISTORY: Chronic smoker, greater than 20-pack-year history, drinks alcohol on a regular basis including beer and wine. Denies any recreational drug usage in the recent past. States he is physically active and ambulatory.   HOME MEDICATIONS: Remeron 15 mg p.o. at bedtime, albuterol inhaler 2 puffs q.i.d. p.r.n. for wheezing.   ALLERGIES: No known drug allergies.   REVIEW OF SYSTEMS:   CONSTITUTIONAL: As in HPI. No fever or chills.  HEENT: Denies headaches, dizziness, epistaxis, ear or jaw pain.  CARDIAC: Denies any chest pain, angina, palpitation, orthopnea or PND.   LUNGS: Has chronic dyspnea on exertion, chronic mild cough. Denies any chest pain or hemoptysis.  GASTROINTESTINAL: As in HPI. No nausea currently. No diarrhea or blood in stools.  GENITOURINARY: No dysuria or hematuria.  SKIN: No new rashes or pruritus.  HEMATOLOGIC: No obvious bleeding symptoms.  MUSCULOSKELETAL: Denies any new bone pains.  EXTREMITIES: No new swelling or pain.  NEUROLOGIC: No new focal weakness, seizures or loss of consciousness.  ENDOCRINE: No polyuria or polydipsia.   PHYSICAL EXAM:  GENERAL: The patient is a moderately built and nourished individual, sitting on the side of the bed, alert and oriented to self, place, person and time and converses appropriately. No icterus. Mild pallor.  VITAL SIGNS: 98.5, 79, 18, 120/70 and 100% on room air.  HEENT: Normocephalic, atraumatic. Extraocular movements intact. Sclerae anicteric. No oral thrush.  NECK: Negative for lymphadenopathy.   CARDIOVASCULAR: S1 and S2, regular rate and rhythm.  LUNGS: Bilateral good breath sounds. No rhonchi.  ABDOMEN: Soft, nontender. No organomegaly clinically.  EXTREMITIES: No major edema or cyanosis.  SKIN: No generalized rashes.  LYMPHATICS: No adenopathy in the axillary or inguinal areas.  NEUROLOGIC: Limited examination. Cranial nerves intact, moves all extremities spontaneously.  MUSCULOSKELETAL: No obvious joint deformity or swelling.   LAB RESULTS: April 15th WBC 8200, hemoglobin 12.6, platelets 217, ANC 5600, creatinine 0.79, potassium 4.3, calcium 8.5, liver functions unremarkable including albumin of 3.4.   IMPRESSION AND RECOMMENDATIONS: A 54 year old gentleman with history of  smoking, alcohol usage, admitted with progressive dysphagia and workup with esophagogastroduodenoscopy shows partially  obstructing esophageal mass in the middle one-third of the esophagus. CT scan of the chest also reports paratracheal and left hilar lymphadenopathy raising suspicion for metastatic disease. No other distant metastasis reported. The patient and family present, including his mother and his niece, were explained about findings on CT scan and esophagogastroduodenoscopy and the biopsy report is pending, but clinically there is a high suspicion that he has esophageal cancer with possible lymph node involvement, in which case this will be at least stage III esophageal cancer per clinical staging. Will get PET scan for further staging workup and await biopsy report, and then make further plan of management, including see if he would be a surgical candidate versus pursuing other treatments including chemoradiation if tumor is unresectable. He does not have any acute pain issues or bleeding at this time. Continue current supportive treatment. Monitor caloric intake and nutrition closely. Will continue to follow. The patient and family present are agreeable to this plan.   Thank you for the referral. Please feel free to contact me if any additional questions.   ____________________________ Maren Reamer Sherrlyn Hock, MD srp:gb D: 06/01/2012 00:45:26 ET T: 06/01/2012 01:15:43 ET JOB#: [id number]  cc: Decklyn Hyder R. Sherrlyn Hock, MD, <Dictator> Wille Celeste MD ELECTRONICALLY SIGNED 06/02/2012 7:41

## 2014-06-06 NOTE — Consult Note (Signed)
Impression:   54yo male w/ h/o esophageal CA, metastatic adenoCA of unknown primary and cavitary lung disease admitted with necrotizing pneumonia.    He has had occasional fever and chills with occasional black sputum production.  His CT shows a necrotizing area.  He gives a history of recurrent regurgitation which puts him at high risk for aspiration.   Will need to provide prolonged antibiotics for presumed infectious etiology (although a necrotizing maligant mass is also possible).   Will change him to unasyn.  This will allow conversion to augmentin as an outpatient.  He may need weeks to months of therapy.   Will need to follow serial CT scans before deciding on stopping antibiotics.   To go for PEG tube to help alleviate risk of repeated aspiration. 6)            Follow CBC. 7)            He was ruled out for TB at his last visit.  No need for isolation.  Electronic Signatures: Belem Hintze MPH, Rosalyn GessMichael E (MD) (Signed on 05-Jun-14 15:21)  Authored   Last Updated: 05-Jun-14 15:32 by Kenyon Eichelberger MPH, Rosalyn GessMichael E (MD)

## 2014-06-06 NOTE — Discharge Summary (Signed)
PATIENT NAME:  Jared Valdez, Jared Valdez MR#:  [id number] DATE OF BIRTH:  1960/05/01  DATE OF ADMISSION:  07/17/2012 DATE OF DISCHARGE:  07/25/2012  DISCHARGE DIAGNOSES: 1. Left lung pneumonia, likely aspiration.  2. Esophageal cancer, dysphagia, aspiration risk - had percutaneous endoscopic gastrostomy tube placement 07/20/2012.  3. Hypomagnesemia.   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 The patient was admitted to the hospital on 06/03 since he had been started on outpatient antibiotic therapy the week before for a large left lung pneumonia and did not want hospitalization but was beginning to feel worse on 06/03 with persistent fever. He also had decreased oral intake and clinical dehydration. The patient was found to have a large left-sided pneumonia with necrotic center on CT scan of the chest done on 06/03 for follow-up and was admitted to hospital for further care. He also had further unintentional weight loss.   For past medical history, surgical history, home medications, allergies, review of systems, exam, and labs - please refer to history and physical note for details.   HOSPITAL COURSE: The patient was admitted to the oncology floor, he was started on aggressive IV fluid hydration at 125 mL per hour and renal functions and electrolytes were monitored. He had persistent hypomagnesemia and this was corrected with IV magnesium sulfate. He had fever along with a large left necrotizing pneumonia and was treated with broad-spectrum antibiotic coverage. Infectious disease specialist Dr. Leavy CellaBlocker, was consulted and his recommendations followed, the patient continued to do well on IV Unasyn and discharged to home on oral  Augmentin for a prolonged course given the large pneumonia. The patient also was made aware that another possibility of this large left lung abnormality could be underlying malignancy, although it was felt less likely since this lesion was not present on a CT scan done six weeks prior and also since his esophageal mass was smaller after the first cycle of chemotherapy. The patient was evaluated by speech therapy they declared aspiration risk and recommended no food or medications by mouth. The patient, therefore, was agreeable for PEG tube placement and this was done by Dr. Servando SnareWohl on 06/06. He initially tolerated continuous tube feeds well and then was changed over to bolus tube feeds, which he was tolerating well on the date of discharge. The patient also had generalized weakness and physical therapy started to help him ambulate better. He did not have any dizziness or shortness of breath on ambulating short distances in the hospital. He remained afebrile and was discharged home on 06/11 with a course of Augmentin liquid for a few weeks.   DISCHARGE MEDICATIONS:  Augmentin 400 mg - 57 mg/5 mL, take 10 mL via PEG tube 2 times a day for 5 weeks.   DISCHARGE DIET: PEG tube feeding with Jevity 1.5 calories  237 mL 6 times a day at 6:00 a.m., 9:00 a.m., 12 noon, 3:00 p.m., 6:00 p.m. 9:00 p.m., flush with 60 mL H2O after each feed.   ACTIVITY: As tolerated.   [facility] with labs and repeat chest x-ray and make further plan regarding cancer therapy.    ____________________________ Maren ReamerSandeep R. Sherrlyn HockPandit, MD srp:rw Valdez: 07/26/2012 13:35:46 ET T: 07/26/2012 15:01:25 ET JOB#: [id number]  cc: Posie Lillibridge R. Sherrlyn HockPandit, MD, <Dictator> [facility] Hospital[facility]DEEP  Linus Galas MD ELECTRONICALLY SIGNED 07/26/2012 16:03

## 2014-06-06 NOTE — Consult Note (Signed)
Brief Consult Note: Diagnosis: alcohol dependence.   Patient was seen by consultant.   Consult note dictated.   Recommend further assessment or treatment.   Comments: Psychiatry: Patient seen. Patient was intoxicated and is drinking daily. Also usiing cocaine. Has made suicidal statements but current behavior is cooperative and not psychotic. Plan to refer patient to [facility] for SA and MH treatment under IVC today. Patient understands and accepts plan.  Electronic Signatures: Audery Amellapacs, Tlaloc Taddei T (MD)  (Signed 08-Jan-14 10:02)  Authored: Brief Consult Note   Last Updated: 08-Jan-14 10:02 by Audery Amellapacs, Jakira Mcfadden T (MD)

## 2014-06-06 NOTE — Consult Note (Signed)
PATIENT NAME:  Jared Valdez, [name] MR#:  [id number] DATE OF BIRTH:  09/22/1960  DATE OF CONSULTATION:  06/02/2012  CONSULTING PHYSICIAN:  Rosalyn GessMichael E. Huber Mathers, MD  CONTINUATION OF DICTATION:  The patient had been having increasing dysphagia over the last 6 months. He states that he has been having more and more trouble with swallowing and having to drink fluids after eating solids in order to get the food to continue down into his stomach. On the day prior to admission, he had an episode of regurgitation, where undigested food came up out of his nose. He does not recall if he aspirated, but had been coughing. He states that over the 6 months he has lost 2 to 3 pounds. He has not had any fevers, chills or sweats, specifically no night sweats. He was admitted to the hospital and was found to have esophageal cancer. A CT scan of the chest demonstrated thickening of the esophageal wall consistent with his malignancy. There was also a cavitary spiculated nodule in the left upper lobe. He underwent an additional CT scan of the chest, abdomen and pelvis 2 days later, which showed the thickening of the esophagus, a cavitated nodule in the left upper lobe, but no other findings of significance in the abdomen. A PET scan demonstrated abnormal localization in the midesophagus and in the left hilum and superior mediastinum, as well as the porta hepatis and right upper quadrant region near the pancreatic head and duodenum. The lung lesion did not obviously light up on the PET scan. The patient states that he is overall feeling well. His dysphagia is somewhat better since admission. He has not had significant cough or shortness of breath. He has been incarcerated in the past and was living in a homeless shelter prior to admission. He does not know anyone who has had tuberculosis, although some of the people at the homeless shelter had been coughing. He states he tested negative for TB via skin test when he was incarcerated.    ALLERGIES:  None.   PAST MEDICAL HISTORY: 1.  Hepatitis C infection. He was diagnosed while incarcerated. He has not been on any therapy. He is unclear if he has active infection. He was HIV negative at that time.  2.  Alcohol abuse.  3.  Asthma.  4.  Depression.   SOCIAL HISTORY:  The patient is currently living in a homeless shelter. He smokes a pack of cigarettes per day. He drinks alcohol regularly. No injecting drug use history.   FAMILY HISTORY:  Positive for asthma and throat cancer.  REVIEW OF SYSTEMS:  GENERAL:  No fevers, chills, sweats. Specifically no night sweats. No malaise. No myalgias. No fatigue.  HEENT:  No headaches. No sinus congestion. No sore throat.  NECK:  No stiffness, no swollen glands.  RESPIRATORY:  Occasional cough. No significant sputum production. No shortness of breath.  CARDIAC:  No chest pains or palpitations.  GASTROINTESTINAL: No nausea or vomiting, other than the episode of regurgitation as described above. He has had some dysphagia. No coffee-ground emesis or hematochezia. No abdominal pain. No change in his bowels.  GENITOURINARY:  No change in his urine.  MUSCULOSKELETAL:  No complaints.  SKIN:  No rashes.  NEUROLOGIC:  No focal weakness.  PSYCHIATRIC:  No complaints.  All other systems are negative.   PHYSICAL EXAMINATION: VITAL SIGNS:  T-max of 99.1, T-current of 98.5, pulse of 77, blood pressure 114/68, 100% on room air.  GENERAL:  A 54 year old black man in no acute  distress.  HEENT: Normocephalic, atraumatic. Pupils equal and reactive to light. Extraocular motion intact. Sclerae, conjunctivae and lids without evidence for emboli or petechiae. Oropharynx shows no erythema or exudate. Teeth and gums are in fair condition.  NECK:  Supple. Full range of motion. Midline trachea. No lymphadenopathy. No thyromegaly.  CHEST:  Clear to auscultation bilaterally, with good air movement. No focal consolidation.  CARDIAC EXAM:   Regular rate and  rhythm without murmur, rub or gallop.  ABDOMINAL EXAM:  Soft, nontender, nondistended. No hepatosplenomegaly. No hernias noted.  EXTREMITY EXAM:  No evidence for tenosynovitis.  SKIN EXAM:  No rashes. No stigmata of endocarditis, specifically, no Janeway lesions or Osler nodes.  NEUROLOGIC:  The patient is awake and interactive, moving all 4 extremities.  PSYCHIATRIC:  Mood and affect appeared normal.   LABORATORY DATA:  BUN 6, creatinine 0.79, bicarbonate 30, anion gap of 2. LFTs are unremarkable. White count was 8.2, with a hemoglobin of 12.6, platelet count of 217. Esophageal biopsy showed invasive squamous cell carcinoma. A chest x-ray showed a small irregular nodular density in the left lung field that had been seen on prior x-rays. There is some prominence in the right superior mediastinum. A CT scan of the chest without contrast showed a focal area of thickening of the esophageal wall versus an infiltrating mass. There was a cavitary spiculated nodule in the left upper lobe. A CT scan of the chest, abdomen and pelvis demonstrated similar findings around the esophagus and the cavitated nodule in the left upper lobe, as well as right paratracheal and left hilar lymph nodes that were borderline enlarged, and tiny ground-glass densities in the left lower lobe. A PET scan demonstrated abnormal localization in the midesophagus, left hilum and superior mediastinum, as well as the porta hepatis/right upper quadrant region near the pancreatic head and the duodenum and the left inguinal region. There was no abnormal intake in the lung lesion.   IMPRESSION:  A 54 year old male with a past history of hepatitis C infection and alcohol abuse, admitted with newly diagnosed esophageal cancer, who was found to have a cavitary lung lesion.   RECOMMENDATIONS: 1.  The possible etiologies of the lung lesion include metastatic disease, primary lung malignancy, tuberculosis, abscess or prior scarring. He had a PET scan,  which did not show uptake in the lung lesion. This makes any of the first 4 less likely. Prior chest x-rays have shown this lesion in the past.   2.  While he does not know anyone who has had tuberculosis, he has been incarcerated and was recently living in a homeless shelter.   3.  Given the possibility of TB, would place on isolation and get AFB smears x 3. He is not coughing currently. If Respiratory Therapy cannot induce any sputum x 3, that would be sufficient to discharge him.   4.  He did have some regurgitation, and could possibly have aspirated. Will start Augmentin for 10 days to treat possible developing abscess.   5.  He was diagnosed with hepatitis C. It is unclear if this is active, given his underlying cancer diagnosis. Workup for this is not critical at this time.   This is a high-level infectious disease consult. Thank you very much for involving me in this patient's care.   ____________________________ Rosalyn Gess. Kamel Haven, MD meb:mr D: 06/02/2012 17:38:34 ET T: 06/02/2012 18:51:11 ET JOB#: [id number]  cc: Rosalyn Gess. Charlann Wayne, MD, <Dictator> Jacquiline Zurcher E Kailyn Vanderslice MD ELECTRONICALLY SIGNED 06/06/2012 14:26

## 2014-06-06 NOTE — Discharge Summary (Signed)
PATIENT NAME:  Jared Valdez, Jared Valdez MR#:  [id number] DATE OF BIRTH:  July 18, 1960  DATE OF ADMISSION:  05/29/2012 DATE OF DISCHARGE:  06/03/2012  DISCHARGE DIAGNOSES: 1.  Esophageal carcinoma, squamous cell carcinoma, lung mass, possible metastasis.  Await sputum acid-fast bacilli result. 2.  Asthma.  3.  Alcohol abuse and tobacco abuse.   CONDITION ON DISCHARGE: Stable.   CODE STATUS: FULL CODE.   DISCHARGE MEDICATIONS: 1.  Remeron 15 mg oral tablet once a day.  2.  Albuterol 2 puffs inhaled 4 times a day as needed for wheezing.  3.  Nicotine patch 14 mg transdermal once a day.   HOME HEALTH ON DISCHARGE: No.   HOME OXYGEN: No.   DIET ON DISCHARGE: Regular. Diet consistency regular.   ACTIVITY LIMITATION: As tolerated.   TIMEFRAME TO FOLLOW UP:  Within 1 to 2 weeks with Dr. Sherrlyn Hock. Advised to have his result of sputum AFB followed when he follows with Dr. Sherrlyn Hock in office.  If it is positive, he needs to come back to the ER.   HISTORY OF PRESENT ILLNESS: The patient is a 54 year old male with history of asthma, depression and substance abuse, alcohol abuse, who presented in the Emergency Room with the chief complaint of dysphagia for 3 months. He was alert, awake and oriented, in no acute distress. He said that dysphagia was on and off for the past 3 months and feels food is stuck while he was swallowing.  He had also weight loss of 5 pounds over the last 2 months. He denied any other symptoms on presentation.  HOSPITAL COURSE AND STAY:  For dysphagia, the plan was to do barium swallow and GI consult. There was a mass in the esophagus,  EGD was done, in mid upper esophagus circumferential area partially obstructing. Dr. Mechele Collin did endoscopy and he did the biopsy of the mass which showed squamous cell carcinoma of esophagus.  On CT of the chest, there was a cavitary mass in left upper lobe of the lung and so because of his poor immune status due to cancer we suspected him having tuberculosis  also.  We kept him in respiratory isolation. Dr. Leavy Cella saw him and sputum was collected, but the chances of having tuberculosis is very low without any specific symptoms for TB.  He was discharged home and later on the result followed and AFBs were negative. Dr. Sherrlyn Hock from oncology also followed him while he was in the hospital. PET scan was done to check the extent of the cancer and he suggested him to follow in the clinic within a week to plan for further treatment of his cancer.  Other medical issues addressed in the hospital: 1.  Questionable cavitary mass in the lung. As mentioned above, suspected for TB.  Sputum for AFB was collected, which was negative.  2.  Asthma. This was stable and we continued his nebulizer and inhalers in the hospital.  3.  Alcohol abuse and tobacco abuse. He was maintained on CIWA protocol, but he did not have any signs of withdrawal or agitation. 4.  Tobacco abuse. Smoking cessation counseling was done and nicotine patch was offered while he was in the hospital.   IMPORTANT LABORATORY AND DIAGNOSTICS:  In the hospital:  Pathology report of the samples from EGD:  Invasive squamous cell carcinoma, well-differentiated focally keratinizing.   CT of chest and abdomen with contrast:  Circumferential thickening of the mid esophagus, more concerning of malignancy. Borderline enlarged right peritracheal and left hilar lymph node. Small cavitary  nodule in left upper lobe.  Possibility would include malignancy, metastatic disease, as well as infectious or inflammatory etiology.  Tiny ground-glass nodular densities in left lower lobe and posterior left upper lobe. Nonspecific, but differentials may include infectious or inflammatory; also aspiration.  QuantiFERON-TB test is negative.   PET scan was done which reported abnormal localization in mid esophagus, localized in left hilum and superior mediastinum as well as porta hepatis in right upper quadrant region near pancreatic  head and duodenal head and in left inguinal region.  Abnormal lesions in the lung.   Acid-fast stainings negative on sputum.  CONSULTANTS:  In the hospital: Dr. Mechele CollinElliott for gastroenterology, Dr. Janese BanksSandeep Pandit for oncology, Dr. Orson AloeMichael Blocker for infectious disease and Dr. Erin FullingKurian Kasa for pulmonary disease.   TOTAL TIME SPENT ON THIS DISCHARGE: 45 minutes.  ____________________________ Hope PigeonVaibhavkumar G. Elisabeth PigeonVachhani, MD vgv:sb D: 06/06/2012 07:54:12 ET T: 06/06/2012 08:24:55 ET JOB#: [id number]  cc: Hope PigeonVaibhavkumar G. Elisabeth PigeonVachhani, MD, <Dictator> Sandeep R. Sherrlyn HockPandit, MD Scot Junobert T. Elliott, MD Rosalyn GessMichael E. Blocker, MD Dory LarsenKurian D. Kasa, MD   Altamese DillingVAIBHAVKUMAR Presli Fanguy MD ELECTRONICALLY SIGNED 06/09/2012 22:47

## 2014-06-06 NOTE — H&P (Signed)
PATIENT NAME:  Jared Valdez, Jared Valdez MR#:  [id number] DATE OF BIRTH:  Sep 20, 1960  DATE OF ADMISSION:  05/29/2012  PRIMARY CARE PHYSICIAN: None local.  PRIMARY PHYSICIAN: Dr. Darnelle Catalan.  CHIEF COMPLAINT: Dysphagia, 3 months.   HISTORY OF PRESENT ILLNESS: A 55 year old African American male with a history of asthma, depression, substance abuse, alcohol abuse, who presented in the ED with the above chief complaint. The patient is alert, awake, oriented, in no acute distress. The patient said he has had dysphagia on and off for the past 3 months. He feels food stuck with swallowing tube. In addition, the patient has a weight loss of 5 pounds for the past 2 months and weakness. The patient denies any other symptoms.   PAST MEDICAL HISTORY: Asthma, depression, substance abuse, alcohol abuse.   SOCIAL HISTORY: Smokes 1 pack a day for more than 20 years. Drinks alcohol on a daily basis. Drinks a beer, 4 ounces daily. Denies any drug abuse.   SURGICAL HISTORY: No.   FAMILY HISTORY: No.  ENERGY:  No.  HOME MEDICATIONS: romeron 15 mg p.o. daily at bedtime, albuterol CFC-free 90 mcg inhalation 2 puffs 4 times a day p.r.n. for wheezing.   REVIEW OF SYSTEMS:  CONSTITUTIONAL: The patient denies any fever or chills. No headache or dizziness, but has weakness and weight loss.  EYES: No doubled vision or blurred vision.  ENT: No postnasal drip, epistaxis, or slurred speech, but has dysphagia.  CARDIOVASCULAR: No chest pain, palpitation, orthopnea, or nocturnal dyspnea. No leg edema.  PULMONARY: Some occasional cough. No sputum, shortness of breath, or hematemesis.  GASTROINTESTINAL: Positive for dysphagia, but no abdominal pain, nausea, vomiting, or diarrhea. No melena or bloody stool.  GENITOURINARY: No dysuria, hematuria, or incontinence.  SKIN: No rash or jaundice.  HEMATOLOGY: No easy bruising, bleeding.  ENDOCRINE: No polyuria, polydipsia, heat or cold intolerance.  NEUROLOGY: No syncope, loss of  consciousness or seizure.   PHYSICAL EXAMINATION: VITAL SIGNS: Temperature 97.8, blood pressure 147/97, respirations 20, oxygen saturation 99% on room air.  GENERAL: The patient is alert, awake, oriented, in no acute distress.  HEENT: Pupils round, equal, reactive to light and accommodation.  NECK: Supple. No JVD or carotid bruits. No lymphadenopathy. No thyromegaly. Moist oral mucosa. Clear oropharynx.   CARDIOVASCULAR: S1, S2. Regular rate, rhythm. No murmurs, gallops.  PULMONARY: Bilateral air entry. No wheezing, rales. No use of accessory muscles to breathe.  ABDOMEN: Soft. No distention or tenderness. No organomegaly. Bowel sounds present.  EXTREMITIES: No edema, clubbing, or cyanosis. No calf tenderness. Strong bilateral pedal pulses.  SKIN: No rash or jaundice.  NEUROLOGY: Alert and oriented x 3. No focal deficit. Power 5/5. Sensation intact.   CAT scan of the chest showed a focal area of thickening of the esophageal wall versus infiltrate or mass. Further evaluation with direct visualization recommended.   Second one is a cavitary spiculated nodule, nine left upper lobe.   Chest x-ray showed a small, irregular nodular density in the left mid-lung, similar to a recent prior study, and mild prominence of right superior mediastinum.  CBC: WBC 8.2, hemoglobin 12.6, platelets 217, glucose 88, BUN 6, creatinine 0.79.   Electrolytes were normal. Anion gap 2.   IMPRESSIONS: 1.  Progressive dysphagia; rule out esophageal cancer.  2.  History of asthma.  3.  Alcohol abuse.  4.  Tobacco abuse.   PLAN OF TREATMENT: 1.  The patient will be admitted to the medical floor. We will get a barium swallow study and gastroenterology consult.  2.  For alcohol abuse, we will start a CIWA protocol.  3.  For tobacco abuse, smoking cessation was counseled for 5 minutes.  4. GI and deep vein thrombosis prophylaxis: Discussed the patient's condition and plan of treatment with the patient.   TIME  SPENT: About 50 minutes.    ____________________________ Shaune PollackQing Eann Cleland, MD qc:dm D: 05/29/2012 14:57:42 ET T: 05/29/2012 15:11:56 ET JOB#: [id number]  cc: Shaune PollackQing Tylena Prisk, MD, <Dictator> Shaune PollackQING Shondrea Steinert MD ELECTRONICALLY SIGNED 06/01/2012 10:14

## 2014-06-06 NOTE — Op Note (Signed)
PATIENT NAME:  Enriqueta ShutterOTEAT, Brant D MR#:  [id number] DATE OF BIRTH:  01-06-1961  DATE OF PROCEDURE:  06/13/2012  PREOPERATIVE DIAGNOSES:  1. Esophageal cancer.  2. Poor venous access.  3. Hepatitis C.   POSTOPERATIVE DIAGNOSES: 1. Esophageal cancer.  2. Poor venous access.  3. Hepatitis C.   PROCEDURE: 1. Ultrasound guidance for vascular access, right jugular vein.  2. Fluoroscopic guidance for placement of catheter.  3. Placement of a CT compatible Infuse-a-Port, right jugular vein.   SURGEON: Annice NeedyJason S. Rindy Kollman, MD  ANESTHESIA: Local with moderate conscious sedation.   ESTIMATED BLOOD LOSS: Minimal.   FLUOROSCOPY TIME: Less than 1 minute.   CONTRAST USED: None.   INDICATION FOR PROCEDURE: A 54 year old African-American male with recent diagnosis of esophageal cancer. He was discharged from the hospital last week. He has poor venous access for chemotherapy and intravenous medications, and a Port-A-Cath was requested.   DESCRIPTION OF THE PROCEDURE: The patient is brought to the vascular and interventional radiology suite. The right neck and chest were sterilely prepped and draped, and a sterile surgical field was created. The right jugular vein was visualized with ultrasound and found to be widely patent. It was then accessed under direct ultrasound guidance without difficulty with a Seldinger needle, and a J-wire was placed. After skin nick and dilatation, the peel-away sheath was placed over the wire. I then anesthetized an area 2 fingerbreadths below the right clavicle. A transverse incision was created. An inferior pocket was created with electrocautery and blunt dissection. The port was secured to the chest wall with 2 Prolene sutures and connected to the catheter. The catheter was tunneled from the subclavicular incision to the access site. Fluoroscopic guidance was used to cut the catheter to an appropriate length. It was placed through the peel-away sheath, and the peel-away sheath was  removed. The catheter tip was parked in excellent location at the cavoatrial junction. It withdrew blood well and flushed easily with heparinized saline, and antibiotic impregnated saline was used to irrigate the wounds. The wounds were closed with running 3-0 Vicryl and 4-0 Monocryl. Access incision was closed with a single 4-0 Monocryl. Dermabond was placed as a dressing. The patient tolerated the procedure well and was taken to the recovery room in stable condition.     ____________________________ Annice NeedyJason S. Jarod Bozzo, MD jsd:OSi D: 06/13/2012 11:56:24 ET T: 06/13/2012 12:18:57 ET JOB#: [id number]  cc: Annice NeedyJason S. Kooper Godshall, MD, <Dictator> Annice NeedyJASON S Kayona Foor MD ELECTRONICALLY SIGNED 06/13/2012 13:24

## 2014-06-06 NOTE — H&P (Signed)
PATIENT NAME:  Jared Valdez, Jared Valdez MR#:  [id number] DATE OF BIRTH:  Aug 05, 1960  DATE OF ADMISSION:  07/17/2012  CHIEF COMPLAINT AND NEED FOR ADMISSION:  Progressive dehydration due to poor oral intake, esophageal cancer along with metastatic adenocarcinoma in left groin area of unknown primary, on chemotherapy, left lung aspiration pneumonia, low-grade fever.   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  The patient has completed 1 cycle of chemotherapy with cisplatin/Taxotere/5-FU.  Last week he had low grade fevers and chest x-ray showed left-sided cavitary pneumonia and he did not want hospitalization and did not want to consider feeding tube placement for possible aspiration risk.  He was given IV antibiotics as outpatient and placed on oral antibiotics.  He returned for a follow-up today with a CT scan of the chest which continues to show a large left-sided pneumonia with a necrotic center.  The patient clinically stated that he is feeling worse, he continues to have fever, progressive cough and pleuritic-type left-sided chest pain.  He is unable to eat and drink well and is feeling significantly weak and dizzy on getting up and ambulating.  He has further lost weight unintentionally.   PAST MEDICAL HISTORY AND PAST SURGICAL HISTORY:  1.  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  2.  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  3.  Depression.  4.  Substance abuse.  5.  Alcohol abuse.  6.  Smoking.  7.  Asthma.  8.  Hepatitis C positive April 2014.   FAMILY HISTORY:  Noncontributory.    SOCIAL HISTORY:  History of chronic smoking, also was drinking alcohol on a regular basis up until a few weeks ago.  Denies any recent recreational drug usage.   ALLERGIES:  No known drug allergies.   HOME MEDICATIONS: 1.  Augmentin 875 mg by mouth twice daily.  2.  Flagyl 500 mg by mouth 3 times daily.  3.  Phenergan 25 mg q. 6 hours as needed.  4.  Decadron premedication prior to chemotherapy treatments.    REVIEW OF SYSTEMS: CONSTITUTIONAL:  As in history of present illness.  No night sweats.  Has low grade fever.  HEENT:  Denies headaches, epistaxis, ear or jaw pain.  No new sinus symptoms.  CARDIAC:  Denies angina, palpitation, orthopnea or PND.  LUNGS:  Has dyspnea on exertion, cough, scanty sputum.  No hemoptysis.  Has left-sided chest pain on coughing.  GASTROINTESTINAL:  Denies any nausea or vomiting.  Has some difficulty in swallowing certain foods.  Denies hematemesis, bright red blood in stools or melena.  GENITOURINARY:  No dysuria or hematuria.  SKIN:  No new rashes or pruritus.  HEMATOLOGIC:  Denies bleeding symptoms.  MUSCULOSKELETAL:  No new bone pains.  NEUROLOGIC:  No new focal weakness, seizures or loss of consciousness.  Has dizziness on getting up and ambulating.  ENDOCRINE:  No polyuria or polydipsia.  Appetite is poor.   PHYSICAL EXAMINATION: GENERAL:  The patient is weak and frail-looking, otherwise alert and oriented to self, place, and person and converses appropriately.  No icterus.  VITAL SIGNS:  98.1, 105, 18, 121/82, 97% on room air.  HEENT:  Normocephalic,  atraumatic.  Extraocular movements intact.  No oral thrush.  Mouth is dry.  NECK:  Negative for lymphadenopathy.  CARDIOVASCULAR:  S1, S2, regular rate and rhythm.  LUNGS:  Show bilateral good air entry, slightly decreased left upper lobe area with some crepitations.  No rhonchi.  ABDOMEN:  Soft, nontender.  No hepatosplenomegaly clinically.  EXTREMITIES:  Shows no major edema or cyanosis.  SKIN:   Shows no generalized rashes or bruising.  NEUROLOGIC:  Limited examination, cranial nerves intact, moves all extremities spontaneously.  Gait is slow.   LABORATORY, DIAGNOSTIC, AND RADIOLOGICAL DATA:  WBC 20,200 with ANC 15,800, hemoglobin 11.2, platelets 419, creatinine 1.24, sodium 135, potassium 4.2, calcium 9.0.  Blood cultures from May 27th remain negative.   IMPRESSION AND PLAN:   1.  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 Excell Seltzerooper, esophageal mass, does seem slightly smaller indicative of response to treatment.  Also, left lung consolidation and cavitation with necrotic material seems to have quickly developed since it was not present on recent CT scan in mid-April, making it more likely infectious etiology from aspiration.  2.  Poor oral intake, clinical dehydration.  We will admit to hospital and start him on IV fluids half-normal saline 125 mL/h, continue to monitor renal functions and electrolytes.  3.  Left-sided pneumonia, most likely etiology is aspiration given that he has been recently choking on certain solid foods.  We will maintain nothing by mouth except ice and medications at this time, get speech therapy to evaluate for aspiration risk and recommend safe diet if feasible.  If he does have significant aspiration risk, I have again explained to patient that we will need to consider placement of PEG tube for feeding purposes and states that he will consider it this time around.  We will place on broad-spectrum IV antibiotic coverage with cefepime 2 grams q. 12 hours and clindamycin 600 mg q. 8 hours for aspiration pneumonia and continue to monitor.  Oxygen as needed, morphine sulfate IV as needed for pain.  Tylenol as needed for pain or fever.  If he  develops fever, we will need to get blood and urine cultures also along with sputum culture.   The patient explained above details and he is agreeable to this plan.      ____________________________ Maren ReamerSandeep R. Sherrlyn HockPandit, MD srp:ea D: 07/18/2012 00:25:59 ET T: 07/18/2012 01:37:01 ET JOB#: [id number]  cc: Darryll CapersSandeep R. Sherrlyn HockPandit, MD, <Dictator> Wille CelesteSANDEEP R Kendell Sagraves MD ELECTRONICALLY SIGNED 07/18/2012 14:55

## 2014-06-06 NOTE — Consult Note (Signed)
PATIENT NAME:  Jared Valdez, Jared Valdez MR#:  [id number] DATE OF BIRTH:  05/15/1960  DATE OF CONSULTATION:  06/02/2012  REFERRING PHYSICIAN:   Dr. Elisabeth PigeonVachhani  CONSULTING PHYSICIAN:  Rosalyn GessMichael E. Schylar Allard, MD  REASON FOR CONSULT:  Cavitary lung lesion.   HISTORY OF PRESENT ILLNESS: The patient is a 54 year old male with a past history significant for hepatitis C infection and alcohol abuse, who was admitted on April 15 with approximately a 6364-month history of progressive dysphagia. The patient states that he had been having some discomfort with swallowing                     DICTATION ENDS HERE  ____________________________ Rosalyn GessMichael E. Azizi Bally, MD meb:mr D: 06/02/2012 17:22:00 ET T: 06/02/2012 18:46:20 ET JOB#: [id number]  cc: Rosalyn GessMichael E. Lourie Retz, MD, <Dictator> Bambie Pizzolato E Brieann Osinski MD ELECTRONICALLY SIGNED 06/06/2012 14:26

## 2014-06-06 NOTE — Consult Note (Signed)
PATIENT NAME:  Jared Valdez, Jared Valdez MR#:  [id number] DATE OF BIRTH:  [birth year]  DATE OF CONSULTATION:  02/22/2012  REFERRING PHYSICIAN:   CONSULTING PHYSICIAN:  Audery Amel, MD  IDENTIFYING INFORMATION AND REASON FOR CONSULTATION:  This is a 54 year old man with a history of alcohol and cocaine abuse who presented to the Emergency Room for chest pain initially. Consultation because of suicidal ideation and psychiatric needs.   HISTORY OF PRESENT ILLNESS: Information obtained from the patient and the chart. He says that he has been living on the street and has been homeless essentially for years. He drinks a couple of bottles of wine every day. he uses cocaine intermittently. He has been having chest pain which he says has been going on for a couple of months. He came to the Emergency Room for the chest pain, but also because he is feeling tired and overwhelmed by his condition and does not see a way out. His mood is depressed. He has had some passive suicidal thoughts. He has poor sleep at night. Generally feels tired and run down. Feels hopeless and does not feel like he has any clear understanding of the directions to go. He does drink regularly. He is not currently taking any medication.   PAST PSYCHIATRIC HISTORY: No previous evaluations here. He says that he has been to [facility] years ago for substance abuse treatment. Does not report any other history of psychiatric treatment. Denies any actual suicide attempts in the past.   SUBSTANCE ABUSE HISTORY:  Says that he has been drinking for years about the same amount. No history of seizures or delirium tremens. Clearly feels like it is out of control and has prevented him from getting anything done. Also uses cocaine, says that he only that occasionally, but does not quantify it. Denies that he is abusing any other drugs.   SOCIAL HISTORY:  The patient is currently homeless. His closest relatives are his grandparents were extremely elderly and  his mother who is also elderly and married herself and working. The patient says he feels guilty and bad about imposing on any of them, and so he mostly lives out in the street. He has not worked in a long time. Very little access to resources.   PAST MEDICAL HISTORY: He says he has asthma. Wakes up coughing and short of breath most days. Has not been to a doctor in a long time. Does not know of any other medical problems.   MEDICATIONS: None.   ALLERGIES:  No known drug allergies.   REVIEW OF SYSTEMS:   Complains of feeling tired and depressed, run down, hopeless and having suicidal ideation.   Also complains of chronic chest pain, which he says has been going on for months. Does not sound like typical cardiac discomfort.   MENTAL STATUS EXAMINATION:  Disheveled man, looks his stated age or older. Cooperative with the interview. Made good eye contact. Psychomotor activity slow. Speech quiet and slow but easy to understand. Answers questions adequately. Affect blunted and flat. Mood stated as depressed. Thoughts slow but lucid. No evidence of loosening of associations or delusional thinking. He says that he has had some suicidal thoughts recently but does not specify any plan. Denies homicidal ideation. Denies hallucinations. Insight and judgment partial. Baseline intelligence is normal, alert and oriented x 4.  Short-term and longer-term memory grossly intact.   PHYSICAL EXAMINATION: Full physical is not done. The patient does not appear to be in any acute distress.  VITAL SIGNS: Blood  pressure here has been read as 117/72, pulse 103, temperature 96.9, respirations 18.  GENERAL:  He is not shaking and has a normal gait.   LABORATORY AND DIAGNOSTIC DATA:  Drug screen positive for cocaine. Urinalysis unremarkable. Lipase normal at 228. Chemistry panel unremarkable except for a slightly low calcium at 8.4. TSH normal. Alcohol level on presentation was 62. Troponin negative. CBC shows a low hemoglobin  at 12.9, which is just a little below normal. Platelet count is low average 179, MCV elevated at 103.   ASSESSMENT: A 54 year old man with depressed mood and suicidal ideation who is drinking daily, feels that he has been out of control to stop. Shows physical symptoms and labs consistent with long-standing alcohol abuse. He currently has a very extreme lack of resources. No safe disposition. He needs inpatient substance abuse treatment.   TREATMENT PLAN: We arrange for a bed at [place] [facility] [place]. I have filed commitment petition on him on the grounds that he has voiced suicidal ideation. We will be arranging transportation and discharging him there. Medical treatment otherwise per the Emergency Room physicians.   DIAGNOSIS PRINCIPAL AND PRIMARY: AXIS I: Alcohol dependence.   SECONDARY DIAGNOSES: AXIS I:    Depression, not otherwise specified.                  Cocaine abuse.  AXIS II:   Deferred.  AXIS III:  Asthma.                  Anemia, probably related to alcohol use.  AXIS IV: Severe, homelessness.  AXIS V:  Functioning at time of admission 35.    ____________________________ Audery AmelJohn T. Clapacs, MD jtc:ct D: 02/22/2012 10:13:15 ET T: 02/22/2012 10:44:51 ET JOB#: [id number]  cc: Audery AmelJohn T. Clapacs, MD, <Dictator> Audery AmelJOHN T CLAPACS MD ELECTRONICALLY SIGNED 02/22/2012 11:47

## 2014-06-06 NOTE — Consult Note (Signed)
PATIENT NAME:  Jared Valdez, BRAMBLETT MR#:  [id number] DATE OF BIRTH:  1960/09/14  DATE OF CONSULTATION:  07/19/2012  REFERRING PHYSICIAN:  Dr. Sherrlyn Hock CONSULTING PHYSICIAN:  Rosalyn Gess. Kang Ishida, MD  REASON FOR CONSULTATION:  Necrotizing pneumonia.   HISTORY OF PRESENT ILLNESS:  The patient is a 54 year old male with a past history significant for esophageal cancer and metastatic adenocarcinoma of unknown primary and a cavitary lung lesion who was admitted on 06/03 with dehydration and decreased p.o. intake and he was found to have a necrotizing area in his lung. The patient states that he did well for a few days after his previous admission in April when he began having some increased regurgitation. He states that as long as he ate small amounts of food and did not drink much, everything would stay down but if he tried to push the amount or the degree of solids that he ate, he would regurgitate almost immediately. This has been happening for several weeks. He describes having some fevers and chills at times but not regularly and occasionally he has had some episodes of productive sputum with black material being expressed. He has had some cough but no significant shortness of breath. He has been generally weak. On admission, he was thought to be dehydrated. He has improved with hydration. He is currently n.p.o. and is being considered for a PEG tube. The patient was hospitalized in April 2014 and at the time he was noted to have a cavitary lesion. He underwent AFB cultures that were negative for tuberculosis. He was discharged off antibiotics. He denies any sinus congestion. He has not had any significant change in his bowels. He had no change in his urine. Currently, he is on cefepime and clindamycin.   ALLERGIES:  None.   PAST MEDICAL HISTORY:  1.  Esophageal cancer. He initially was started on chemotherapy but after his second cancer was discovered, his chemotherapy regimen was changed. He has received one  further chemotherapy regimen approximately three weeks ago.  2.  Metastatic adenocarcinoma with unknown primary found in a lymph node.  3.  Cavitary lung disease.  4.  Hepatitis-C infection.  5.  Alcohol abuse.  6.  Asthma.  7.  Depression.   SOCIAL HISTORY:  The patient is a current smoker and was drinking regularly up until a few weeks ago, no history of injecting drug use. He has been incarcerated in the past.   FAMILY HISTORY:  Positive for asthma and throat cancer.   REVIEW OF SYSTEMS:  GENERAL:  Positive fevers and chills at times. Positive malaise. Positive generalized weakness.  HEENT:  No headaches. No sinus congestion. No sore throat.  NECK:  No stiffness. No swollen glands.  RESPIRATORY:  Positive cough. Occasional sputum production with black sputum produced, no significant shortness of breath currently.  CARDIAC:  No chest pains or palpitations.  GASTROINTESTINAL:  No nausea, no vomiting, although he has had recurrent regurgitation as described in the H and P. He has had some dysphagia as well. No coffee-ground emesis or hematochezia. No abdominal pain, no change in his bowels.  GENITOURINARY:  No change in his urine.  MUSCULOSKELETAL:  No muscle or joint complaints.  SKIN:  No rashes.  NEUROLOGIC:  He is generalized weak but no specific focal weakness.  PSYCHIATRIC:  No complaints. All other systems are negative.   PHYSICAL EXAMINATION:  VITAL SIGNS:  T-max of 102.2, T-current of 99.1, pulse of 93, blood pressure 198/54, 100% on room air.  GENERAL:  A  54 year old black male in no acute distress.  HEENT:  Normocephalic, atraumatic. Pupils equal and reactive to light. Extraocular motion intact. Sclerae, conjunctivae, and lids are without evidence for emboli or petechiae. Oropharynx shows no erythema or exudate. Gums are in fair condition.  NECK:  Supple. Full range of motion. Midline trachea. No lymphadenopathy. No thyromegaly.  LUNGS:  Clear to auscultation bilaterally with  good air movement. No focal consolidation.  HEART:  Regular rate and rhythm without murmur, rub or gallop.  ABDOMEN:  Soft, nontender and nondistended. No hepatosplenomegaly. No hernias noted.  EXTREMITIES:  No evidence for tenosynovitis.  SKIN:  No rashes.  NEUROLOGIC:  No focal weakness.  PSYCHIATRIC:  Mood and affect appeared normal.   LABORATORY DATA:  BUN of 7, creatinine 0.85, bicarbonate 25, anion gap of 9. White count of 18.3 with a hemoglobin 9.1, platelet count of 362, ANC of 14.9. White count on admission was 20.2 and an ANC of 15.8. Blood cultures from admission show no growth. A urine culture shows mixed bacterial organisms. No urinalysis was obtained. A CT scan from 06/02 shows a large necrotic-appearing mass at the prior cavitary nodule. There are small amounts of gas present. There is enlargement of mediastinal and a left hilar lymph node. There is some circumferential wall thickening of the mid esophagus that is either stable or slightly less significant than previous scan.   IMPRESSION:  A 54 year old male with a past history significant for esophageal cancer, metastatic adenocarcinoma of unknown primary and a cavitary lung disease who was admitted with necrotizing pneumonia.   RECOMMENDATIONS:  1.  He has had occasional fever and chills with occasional black sputum production. His CT shows a necrotic area. He gives a history of recurrent regurgitation which  puts him at high risk for aspiration.  2.  We will need to provide prolonged antibiotics for presumed an infectious etiology (although a necrotizing malignant mass is also possible).   3.  We will change him to Unasyn. This will allow conversion to Augmentin as an outpatient. He may need weeks to months of therapy.  4.  We will need to follow serial CT scans before deciding on stopping antibiotics.  5.  He is to go for a percutaneous endoscopic gastrostomy tube placement to help alleviate the risk of repeated aspiration.  6.   We will follow his CBC.  7.  During his last hospitalization, he was ruled out for tuberculosis. No need for further isolation at this point in time.   This is a high-level Infectious Disease consult. Thank you very much for involving me in this patient's care.   ____________________________ Rosalyn GessMichael E. Timiya Howells, MD meb:jm D: 07/19/2012 15:32:12 ET T: 07/19/2012 15:58:14 ET JOB#: [id number]  cc: Rosalyn GessMichael E. Devereaux Grayson, MD, <Dictator> Daphene Chisholm E Yadir Zentner MD ELECTRONICALLY SIGNED 07/23/2012 13:45

## 2014-06-06 NOTE — Consult Note (Signed)
CC: esophageal cancer with obstruction requireing a feeding tube.  He is doing well, no real pain at tube site.  On unasyn for lung problem.  Tube feeding up to 40cc/hr. he can taper off iv TPN soon.    Electronic Signatures: Scot JunElliott, Amarachukwu Lakatos T (MD)  (Signed on 08-Jun-14 09:45)  Authored  Last Updated: 08-Jun-14 09:45 by Scot JunElliott, Xitlally Mooneyham T (MD)

## 2014-06-06 NOTE — Consult Note (Signed)
CC: dysphagia.  EGD showed mass in mid upper esophagus with circumferential area partial obstruction.  Dr. Imogene Burnhen notified.  Recommend oncology consult, will need CT and possible PET scan.  Will give full liquid diet.  Electronic Signatures: Scot JunElliott, Telly Jawad T (MD)  (Signed on 16-Apr-14 17:31)  Authored  Last Updated: 16-Apr-14 17:31 by Scot JunElliott, Lennon Boutwell T (MD)

## 2014-06-06 NOTE — Consult Note (Signed)
CC: esophageal cancer with PEG placement.  Pt doing well day after PEG placement, infusion started at 20cc/hr. some coughing of dark plegm, no vomiting, brown stool.  breathing OK.  tolerating discomfort of PEG pretty well.  Also being worked up for metastatic adenoca of unknown origin, not felt to be the small cavitry lung area.  Pt told that if his esophagus improves that he could eat soft food eventually.  Electronic Signatures: Scot JunElliott, Mauricio Dahlen T (MD)  (Signed on 07-Jun-14 12:48)  Authored  Last Updated: 07-Jun-14 12:48 by Scot JunElliott, Janiel Crisostomo T (MD)

## 2014-06-06 NOTE — Consult Note (Signed)
ONCOLOGY followup note - states he is eating solid food and able to swallow.  Denies pain issues.  Wants to go home today.denies fever.  Denies bleeding symptoms.A, O x 3, NAD            vitals - 98.3, 73, 17, 139/79, 100% on room air           Lungs - bilateral good air entry           Abdomen - soft, nontender.           Ext - has enlarged lymph nodes in left inguinal area measuring upto 2-3 cm. on 4/19 - Cr 0.77.  PET scan - IMPRESSION:  1. Abnormal localization in the mid esophagus with localization in the is left hilum and superior mediastinum as well is in the porta hepatis/right upper quadrant region near the pancreatic head and duodenum and in the left inguinal region. Abnormal lesions in the lungs without significant F-18 FDG accumulation.  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  Patient explained above PET scan showing multiple areas of abnormality in the mediastinum, left hilum, upper abdomen suspicious for metastatic lymphadenopathy.  In addition there is abnormality in the left inguinal region with clinically palpable lymphadenopathy, this will need to be biopsied, patient does not want to stay in hospital to get this done.  If he is discharged today, will schedule him for outpatient ultrasound guided needle biopsy and then followup and make decision since we will need to evaluate if this is metastatic site from esophageal cancer versus other malignancy versus benign etiology.  He does not have pain issues at this time.  Will followup as outpatient.  Patient agreeable to this plan.   Electronic Signatures: Izola PricePandit, Sheikh Leverich Raj (MD)  (Signed on 20-Apr-14 22:09)  Authored  Last Updated: 20-Apr-14 22:09 by Izola PricePandit, Tabb Croghan Raj (MD)

## 2014-06-06 NOTE — Consult Note (Signed)
CC: dysphagia. esoph mass on EGD.  CT with mass at level of carina, cavitated small nodule in upper lung. Possible neoplasm,  Worry about possible  TB or neoplasm.  Consider ID consult, consider resp isolation. Tolerating full liq diet well.  Dr. Servando SnareWohl will cover for me starting tomorrow but will not see patient unless you call about a problem.  Electronic Signatures: Scot JunElliott, Serrena Linderman T (MD)  (Signed on 17-Apr-14 18:14)  Authored  Last Updated: 17-Apr-14 18:14 by Scot JunElliott, Sacred Roa T (MD)

## 2014-06-06 NOTE — Consult Note (Signed)
Impression:     54yo male w/ h/o hepatitis C infection and EtOH abuse admitted with newly diagnosed esophageal CA who was found to have a cavitary lung lesion.     The possible etiologies of the lung lesion include:  metastatic disease,primary lung malginancy, tuberculosis, abscess or prior scarring.  He had a PET scan which did not show uptake in the lung lesion.  This makes any of the first four three less likely.    While he does not know of anyone who has had TB, he has been incarcerated and was recently living in a homeless shelter.    Given the possibility of TB, would place on isolation and get AFB x3.  He is not coughing currently.  If respiratory therapy cannot induce any sputum x 3, that would be sufficient to discharge him.    He did have some regurgitation and could have aspirated.  Will start augmentin for 10 days to treat possibly developing abscess. 5)     He was diagnosed with hepatitis C.  It is unclear if this is active.  Given his cancer diagnosis work up for this is not critical at this time.  Electronic Signatures: Mikey Maffett MPH, Rosalyn GessMichael E (MD) (Signed on 19-Apr-14 17:19)  Authored   Last Updated: 19-Apr-14 17:38 by Darcel Zick MPH, Rosalyn GessMichael E (MD)

## 2014-06-06 NOTE — Consult Note (Signed)
PATIENT NAME:  Jared Valdez, Jared Valdez MR#:  [id number] DATE OF BIRTH:  Aug 25, 1960  DATE OF CONSULTATION:  05/29/2012  CONSULTING PHYSICIAN:  Manya Silvas, MD  HISTORY OF PRESENT ILLNESS: The patient is a 54 year old black male living in a homeless shelter who has had 3 months of progressive dysphagia with weight loss of 6 or 7 pounds. He was seen in the ER and the decision was made to admit him to the hospital after CAT scan showed possible esophageal mass. I was asked to see him in consultation.   REVIEW OF SYSTEMS: The patient complains of frequent coughing with a history of asthma that tends to run in his family. He denies any known cardiac disease. Never had a heart attack. He denies any chest pains. No dysuria or hematuria. No constipation or diarrhea.   PAST SURGICAL HISTORY: Small superficial boil on his face. No other surgery.   ALLERGIES: No known drug allergies.  FAMILY HISTORY: Positive for asthma. His father had throat cancer.   SOCIAL HISTORY: He drinks three 40-ounce beers a day. Denies any other drug use. Smokes about 1/2 pack a day for his adult life   PHYSICAL EXAMINATION:  GENERAL: Black male in no acute distress.  HEENT: Sclerae anicteric. Conjunctivae negative. Tongue negative.  CHEST: Clear. Global slightly decreased air flow.  HEART: No murmurs or gallops I can hear.  ABDOMEN: Flat, nontender. No hepatosplenomegaly. No masses. No bruits.  SKIN: Warm and dry.  PSYCHIATRIC: Mood and affect are appropriate.   LABORATORY AND RADIOLOGIC DATA: A CAT scan of the chest showed focal area of thickening in esophageal wall, possible mass versus inflammation. White count 8.2, hemoglobin 12.6, platelet count 217. MET-B: Chloride 109, BUN 6. Liver panel normal. Chest x-ray PA and lateral shows a small irregular nodular density in left mid lung, mild prominence of the right superior mediastinum.   ASSESSMENT AND PLAN: The patient has progressive dysphagia and weight loss with an  abnormal CAT scan. Plan to do an upper endoscopy tomorrow to rule out esophageal cancer.   ____________________________ Manya Silvas, MD rte:jm D: 05/29/2012 19:36:04 ET T: 05/29/2012 20:19:46 ET JOB#: [id number]  cc: Manya Silvas, MD, <Dictator> Conni Slipper, MD Manya Silvas MD ELECTRONICALLY SIGNED 06/27/2012 14:12

## 2014-06-06 NOTE — Consult Note (Signed)
Pt with CT showing 14mm nodule with ulceration in upper left lung.  May be neoplasm.  He does say he lived with someone in the past who had TB.  Hospitalist informed of this finding by me.  Electronic Signatures: Scot JunElliott, Robert T (MD)  (Signed on 17-Apr-14 18:24)  Authored  Last Updated: 17-Apr-14 18:24 by Scot JunElliott, Robert T (MD)

## 2014-06-07 NOTE — Consult Note (Signed)
Referring Physician:  Dorathy Daft, Dawood S :   Reason for Consult: Admit Date: 08-May-2013  Chief Complaint: "Are you going to get me out of here or what?"  Reason for Consult: new onset seizure   History of Present Illness: History of Present Illness:   Mr. Jared Valdez is a 54 yo man with a history notable for metastatic esophageal cancer, Hep C, and substance abuse who presented to [facility] for evaluation of chest pain. While being evaluated in the ED, he had a convulsive event concerning for seizure. Additional workup with brain CT showed multiple bilateral hypodensities consistent with metastatic brain lesions. This was discussed with the patient and his mother, who has elected to proceed with a palliative and hospice approach to his care. The Neurology service has been consulted to assist in the management of his seizures.   ROS:  General weakness   HEENT dysphagia   Lungs chest wall pain   Cardiac chest pain   GI nausea   GU no complaints   Musculoskeletal muscle ache   Extremities no complaints   Skin rash   Neuro seizure   Endocrine no complaints   Psych depression   Past Medical/Surgical Hx:  Cancer: esophageal  Hepatitis C:   throat cancer:   Depression:   Asthma:   polysubstance abuse:   Denies surgical history.:   denies:   Home Medications: Medication Instructions Last Modified Date/Time  Percocet 5/325 325 mg-5 mg oral tablet 1 tablet orally every 6 hours as needed for pain 24-Mar-15 23:21  Ambien 10 mg oral tablet 1 tablet orally once a day (at bedtime) as needed for sleeplessness 24-Mar-15 23:21  esomeprazole 20 mg oral powder for reconstitution, delayed release 1 each orally once a day via PEG tube 24-Mar-15 23:21  gabapentin 100 mg oral capsule 2 cap(s) orally 3 times a day 24-Mar-15 23:21  promethazine 25 mg oral tablet 1 tab(s) orally every 6 hours, As Needed - for Nausea, Vomiting 24-Mar-15 23:21   Allergies:  Allergies  NKDA   Social/Family History: Lives With: roomates in group gouse  Living Arrangements: group home  Social History: 20 pack-year smoking history; still smokes. Drinks 40 oz. beer daily. Denies recent illicit drug use.  Family History: No known FH of seizures.   Vital Signs: **Vital Signs.:   25-Mar-15 05:08  Vital Signs Type Routine  Temperature Temperature (F) 97.8  Celsius 36.5  Temperature Source oral  Pulse Pulse 104  Respirations Respirations 18  Systolic BP Systolic BP 751  Diastolic BP (mmHg) Diastolic BP (mmHg) 63  Mean BP 77  Pulse Ox % Pulse Ox % 94  Pulse Ox Activity Level  At rest  Oxygen Delivery Room Air/ 21 %   EXAM: Makredly cachectic, hypoactive-appearing, no apparent distress. No conjunctival injection or scleral edema. Oropharynx clear. No carotid bruits auscultated. Regular tachycardia on exam. Lungs clear to auscultation bilaterally. Abdomen soft and nontender. Peripheral pulses palpated. No clubbing, cyanosis, or edema in extremities.  MENTAL STATUS: Alert and oriented to person, place, and time. Language fluent and appropriate. Naming and repetition are intact. Attention and motivation appear decreased. CRANIAL NERVES: Visual fields full to confrontation. PERRL. EOMI. Facial sensation intact. Facial muscles full and symmetric. Hearing intact to finger rub. Uvula midline with symmetric palatal elevation. Tongue midline without fasciculations. MOTOR: Tone increased in the left arm with 4/5 strength in all muscle groups in that arm. Diffusely weak throughout otherwise with global 4+/5 strength.  REFLEXES: 3+ in left biceps  and triceps, otherwise 2+ in biceps, triceps, patella, and achilles bilaterally. Hoffman's sign is present in the left hand. EXTENSOR plantar responses bilaterally. SENSORY: Intact to light touch throughout without extinction. COORDINATION: No ataxia or dysmetria on finger-nose. GAIT: Not evaluated.  Lab Results: Hepatic:  24-Mar-15 20:21    Bilirubin, Total  1.2  Alkaline Phosphatase 112 (45-117 NOTE: New Reference Range 01/04/13)  SGPT (ALT) 22  SGOT (AST) 30  Total Protein, Serum 7.5  Albumin, Serum  2.5  Routine Chem:  25-Mar-15 06:47   Glucose, Serum  121  BUN 7  Creatinine (comp) 0.83  Sodium, Serum 139  Potassium, Serum 4.2  Chloride, Serum 106  CO2, Serum 27  Calcium (Total), Serum  8.1  Anion Gap  6  Osmolality (calc) 277  eGFR (African American) >60  eGFR (Non-African American) >60 (eGFR values <37mL/min/1.73 m2 may be an indication of chronic kidney disease (CKD). Calculated eGFR is useful in patients with stable renal function. The eGFR calculation will not be reliable in acutely ill patients when serum creatinine is changing rapidly. It is not useful in  patients on dialysis. The eGFR calculation may not be applicable to patients at the low and high extremes of body sizes, pregnant women, and vegetarians.)  Result Comment labs - This specimen was collected through an   - indwelling catheter or arterial line.  - A minimum of of blood was wasted prior    - to collecting the sample.  Interpret  - results with caution.  Result(s) reported on 08 May 2013 at 07:05AM.  Cardiac:  24-Mar-15 20:21   Troponin I < 0.02 (0.00-0.05 0.05 ng/mL or less: NEGATIVE  Repeat testing in 3-6 hrs  if clinically indicated. >0.05 ng/mL: POTENTIAL  MYOCARDIAL INJURY. Repeat  testing in 3-6 hrs if  clinically indicated. NOTE: An increase or decrease  of 30% or more on serial  testing suggests a  clinically important change)  Routine UA:  24-Mar-15 23:15   Color (UA) Amber  Clarity (UA) Clear  Glucose (UA) Negative  Bilirubin (UA) Negative  Ketones (UA) Negative  Specific Gravity (UA) 1.042  Blood (UA) Negative  pH (UA) 5.0  Protein (UA) 30 mg/dL  Nitrite (UA) Negative  Leukocyte Esterase (UA) Negative (Result(s) reported on 07 May 2013 at 11:56PM.)  RBC (UA) 4 /HPF  WBC (UA) 3 /HPF  Bacteria (UA)  TRACE  Epithelial Cells (UA) <1 /HPF  Mucous (UA) PRESENT  Hyaline Cast (UA) 15 /LPF (Result(s) reported on 07 May 2013 at 11:56PM.)  Routine Hem:  25-Mar-15 06:47   WBC (CBC)  14.0  RBC (CBC)  2.98  Hemoglobin (CBC)  9.7  Hematocrit (CBC)  29.9  Platelet Count (CBC) 191  MCV 100  MCH 32.4  MCHC 32.3  RDW  16.6  Neutrophil % 93.9  Lymphocyte % 4.6  Monocyte % 1.3  Eosinophil % 0.0  Basophil % 0.2  Neutrophil #  13.2  Lymphocyte #  0.6  Monocyte # 0.2  Eosinophil # 0.0  Basophil # 0.0 (Result(s) reported on 08 May 2013 at [facility].)   Radiology Results: CT:    24-Mar-15 21:13, CT Head Without Contrast  CT Head Without Contrast   REASON FOR EXAM:    seizure, new onset, hx of throat cancer  COMMENTS:       PROCEDURE: CT  - CT HEAD WITHOUT CONTRAST  - May 07 2013  9:13PM     CLINICAL DATA:  Chest pain. Abdominal pain and vomiting. Dizziness.  EXAM:  CT HEAD WITHOUT CONTRAST    TECHNIQUE:  Contiguous axial images were obtained from the base of the skull  through the vertex without intravenous contrast.    COMPARISON:  No priors.  FINDINGS:  Multiple areas of low attenuation noted throughout the cerebral  hemispheres bilaterally. Some of these are subcortical, while others  are within the deep white matter. The overall appearance of these  lesions is suggestive of vasogenic edema, and highly suspicious for  multifocal metastases to the brain. The largest of these lesions is  in the high right posterior frontal region (image 23 of series 2).  No definite signs of acute/subacute cerebral ischemia. No evidence  of acute intracranial hemorrhage. No evidence of herniation or  significant mass effect at this time. No hydrocephalus. No acute  displaced skull fractures. Visualized paranasal sinuses and mastoids  are generally well pneumatized, with exception of a small air-fluid  level lying dependently in the left sphenoid sinus, and a small  mucosal retention cyst or  polyp in the left maxillary sinus.   IMPRESSION:  1. The appearance of the brain is highly suspicious for multifocal  cerebral metastases. Further evaluation with brain MRI with and  without IV gadolinium is recommended.  2. Mild paranasal sinus disease, as above.  These results were called by telephone at the time of interpretation  on 05/07/2013 at 9:44 PM to Dr. Lisa Roca, who verbally  acknowledged these results.      Electronically Signed    By: Vinnie Langton M.D.    On: 05/07/2013 21:45      Verified By: Etheleen Mayhew, M.D.,   Impression/Recommendations: Recommendations:   Jared Valdez is a 54 yo man with a history of metastatic esophageal cancer who presented for evaluation of chest pain and had a witnessed convulsive seizure while in the ED (his first ever). His exam shows upper motor neuron weakness in the left arm as well as pathological reflexes suggestive of a CNS process. He is also markedly cachetic with generalized weakness. CT brain showed multiple metastatic-appearing lesions, which are almost certainly related to his seizure. The decision has been made by family to convert to a palliative care approach and transition to hospice care.  I would recommend continuing the levetiracetam you have started for seizure control at current dosing; seizure control is consistent with a comfort measures only approach. However, the brain lesions visualized on CT do not appear to be causing significant mass effect, and I would discontinue the dexamethasone at the time of discharge to hospice.  continue levetiracetam for seizure controldiscontinue steroids at the time that his goals of care are switched to comfort measures only, as this is not likely to provide symptomatic relief and likely will only cause side effects you for the opportunity to participate in Mr. Finazzo's care. Please page neurology consults with further questions.  Electronic Signatures: Carmin Richmond (MD)   (Signed 25-Mar-15 11:39)  Authored: REFERRING PHYSICIAN, Consult, History of Present Illness, Review of Systems, PAST MEDICAL/SURGICAL HISTORY, HOME MEDICATIONS, ALLERGIES, Social/Family History, NURSING VITAL SIGNS, Physical Exam-, LAB RESULTS, RADIOLOGY RESULTS, Recommendations   Last Updated: 25-Mar-15 11:39 by Carmin Richmond (MD)

## 2014-06-07 NOTE — H&P (Signed)
PATIENT NAME:  TILER, BRANDIS MR#:  [id number] DATE OF BIRTH:  1961/01/11  DATE OF ADMISSION:  05/08/2013  REFERRING PHYSICIAN:  Dr. Hinda Kehr.  PRIMARY CARE PHYSICIAN AND ONCOLOGY DOCTOR:  Dr. Ma Hillock.   CHIEF COMPLAINT:  Chest pain and seizures in the Emergency Department.   HISTORY OF PRESENT ILLNESS:  This is a 54 year old male with known history of stage IV metastatic esophageal cancer, lives at home by himself, the patient presents for progressive chest pain, the patient reports his esophageal cancer has been going on for a few months, but it became worse over the last few days, while the patient is in the ED he had an episode of seizures which resolved after he was given Ativan, this is the patient's first episode of seizures, had no history of seizures in the past, the patient had CT head without contrast which did show multiple possible brain metastasis with vasogenic edema, the patient was started with IV Keppra, received IV Decadron as well, the patient did get CT chest angiogram which was negative for PE, the patient's mother at bedside reports the patient is not supposed to take any by mouth intake, the patient reports he has been taking some liquids, ice chips and some mashed potatoes and green beans if he can tolerate, he denies any cough, any productive sputum, but the patient was noticed to have a weak cough in the ED as well, the patient's CT chest did show a possible aspiration as well, the case was discussed with both patient and son, and they were explained the degree of advanced cancer, and son and mother, they request the patient to be DO NOT RESUSCITATE, and they do request hospice and palliative care evaluation and possible placement at hospice home at this point.   PAST MEDICAL HISTORY: 1.  Esophageal metastatic esophageal cancer.  2.  Hepatitis C.  3.  Asthma.  4.  Depression.  5.  Substance abuse.  6.  History of alcohol abuse and smoking.   FAMILY HISTORY:  No  family history of coronary artery disease.   SOCIAL HISTORY:  Chronic smoker, greater than 20 pack-year history.  He reports he drinks around 40 ounces of beer on a daily basis.  He still smokes.  Denies any recent drug abuse.   FAMILY HISTORY:  Denies any family history of coronary artery disease at a young age.  No family history of esophageal cancer.   ALLERGIES:  No known drug allergies.   HOME MEDICATIONS: 1.  Percocet 5/325 1 tablet every six hours as needed.  2.  Gabapentin 100 mg 2 capsules 3 times a day.  3.  Promethazine 25 mg oral every six hours as needed.  4.  Ambien 10 mg oral at bedtime.  5.  Omeprazole 20 mg oral delayed release.   REVIEW OF SYSTEMS: CONSTITUTIONAL:  Denies fever, chills.  Complains of fatigue, weakness.  Reports weight loss. EYES:  Denies blurred vision, double vision, inflammation, glaucoma. EARS, NOSE, THROAT:  Denies tinnitus, ear pain, hearing loss, epistaxis, discharge.  RESPIRATORY:  Reports mild cough.  Denies hemoptysis, dyspnea, COPD.  CARDIOVASCULAR:  Reports chest pain, chronic over the last few months for his esophageal cancer.  Denies any edema, palpitations, syncope.  GASTROINTESTINAL:  Denies nausea, vomiting, diarrhea, abdominal pain, melena, jaundice.  GENITOURINARY:  Denies dysuria, hematuria, renal colic. ENDOCRINE:  Denies polyuria, polydipsia, heat or cold intolerance.  HEMATOLOGY:  Denies anemia, easy bruising, bleeding diathesis.  INTEGUMENTARY:  Denies acne, rash or skin lesions.  MUSCULOSKELETAL:  Denies any swelling, gout or cramps.   NEUROLOGIC:  Denies any history of CVA, TIA, headache, tremors, vertigo.  The patient has seizures in the ED.  PSYCHIATRIC:  Denies history of anxiety, insomnia.  Reports current alcohol abuse.  Denies any suicidal ideation.   PHYSICAL EXAMINATION: VITAL SIGNS:  Temperature 98, pulse 114, respiratory rate 20, blood pressure 127/87, saturating 96% on room air.  GENERAL:  Cachectic, malnourished  males, lies in bed, in no apparent distress.  HEENT:  Head atraumatic, normocephalic.  Pupils equal, reactive to light.  Pink conjunctivae.  Anicteric sclerae.  Dry oral mucosa.  No oral thrush or mucositis.  NECK:  Supple.  No thyromegaly.  No JVD.  CHEST:  [place] air entry bilaterally.  No wheezing, rales, rhonchi.  CARDIOVASCULAR:  S1, S2 heard.  No rubs, murmur, gallops.  Tachycardic.  ABDOMEN:  Scaphoid.  PEG tube present.  No discharge or bleeding from site.  Bowel sounds present.   EXTREMITIES:  No edema.  No clubbing.  No cyanosis.  MUSCULOSKELETAL:  No joint effusion or erythema.  Has generalized significant diffuse muscle wasting.  SKIN:  Normal skin turgor, dry.   PERTINENT LABORATORY DATA:  Glucose 119, BUN 7, creatinine 0.99, sodium 139, potassium 4.4, chloride 106, CO2 25, ALT 22, AST 30, alk phos 112.  Troponin is 0.02.  White blood cells 10.3, hemoglobin 11.7, hematocrit 36.6, platelets 202.  Urinalysis showing three white blood cells, but negative leukocyte esterase and nitrite.    IMAGING STUDIES:  CT head without contrast, appearance of brain is highly suspicious for multifocal cerebral metastasis.  CT angiograph chest for PE, no pulmonary embolism,debris within the trachea extending to the right mainstem bronchus and right lower lobe concerning for aspiration, consolidation with early appearance  of the left lower lobe, scattered tree and bud infiltrate, may be secondary to aspiration or air event, pneumonia with bronchial wall thickening suggesting bronchitis.   ASSESSMENT AND PLAN: 1.  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  They are aware of the very poor prognosis, they do not want any heroic measures to be done, we will consult oncology service, we will consult palliative care, as family request possible need for placement at hospice home.  We will keep him on as needed pain  and nausea medications.  2.  Seizures, new onset.  This is most likely due to brain metastasis, we will continue the patient with Decadron.  He was already loaded with Keppra, we will continue Keppra, we will consult neurology service.  We will keep him on proton pump inhibitor as he is on Decadron.  3.  Malnutrition with dysphagia.  We will continue with feeding via PEG.  We will have him on IV fluids.  4.  Chest pain.  This is due to his esophageal cancer, chronic.  5.  Alcohol abuse.  The patient will be started on CIWA protocol.  6.  Deep vein thrombosis prophylaxis.  Sequential compression device and TED hose.  7.  CODE STATUS:  DISCUSSED AT LENGTH WITH THE PATIENT AND HIS MOTHER.  THE PATIENT WISHES TO BE DNR/DNI AND THEY REQUEST PALLIATIVE CARE AND HOSPICE EVALUATION.   Total time spent on admission and patient care 55 minutes.     ____________________________ Albertine Patricia, MD dse:ea D: 05/08/2013 00:26:17 ET T: 05/08/2013 01:33:23 ET JOB#: [id number]  cc: Albertine Patricia, MD, <Dictator> DAWOOD Graciela Husbands MD ELECTRONICALLY SIGNED 05/09/2013  0:31 

## 2014-06-07 NOTE — Discharge Summary (Signed)
Dates of Admission and Diagnosis:  Date of Admission 07-May-2013   Date of Discharge 01-Jan-0001   Admitting Diagnosis brain metatastis   Final Diagnosis lung cancer stage 4   Discharge Diagnosis 1 esophageal cancer stage 4   2 brain mets   3 severe malnutrition    Chief Complaint/History of Present Illness This is a 54 year old male with known history of stage IV metastatic esophageal cancer, lives at home by himself, the patient presents for progressive chest pain, the patient reports his esophageal cancer has been going on for a few months, but it became worse over the last few days, while the patient is in the ED he had an episode of seizures which resolved after he was given Ativan, this is the patient's first episode of seizures, had no history of seizures in the past, the patient had CT head without contrast which did show multiple possible brain metastasis with vasogenic edema, the patient was started with IV Keppra, received IV Decadron as well, the patient did get CT chest angiogram which was negative for PE, the patient's mother at bedside reports the patient is not supposed to take any by mouth intake, the patient reports he has been taking some liquids, ice chips and some mashed potatoes and green beans if he can tolerate, he denies any cough, any productive sputum, but the patient was noticed to have a weak cough in the ED as well, the patient's CT chest did show a possible aspiration as well, the case was discussed with both patient and son, and they were explained the degree of advanced cancer, and son and mother, they request the patient to be DO NOT RESUSCITATE, and they do request hospice and palliative care evaluation and possible placement at hospice home at this point.   Allergies:  No Known Allergies:   Routine Chem:  25-Mar-15 06:47   Glucose, Serum  121  BUN 7  Creatinine (comp) 0.83  Sodium, Serum 139  Potassium, Serum 4.2  Chloride, Serum 106  CO2, Serum 27   Calcium (Total), Serum  8.1  Anion Gap  6  Osmolality (calc) 277  eGFR (African American) >60  eGFR (Non-African American) >60 (eGFR values <54m/min/1.73 m2 may be an indication of chronic kidney disease (CKD). Calculated eGFR is useful in patients with stable renal function. The eGFR calculation will not be reliable in acutely ill patients when serum creatinine is changing rapidly. It is not useful in  patients on dialysis. The eGFR calculation may not be applicable to patients at the low and high extremes of body sizes, pregnant women, and vegetarians.)  Result Comment labs - This specimen was collected through an   - indwelling catheter or arterial line.  - A minimum of 575m of blood was wasted prior    - to collecting the sample.  Interpret  - results with caution.  Result(s) reported on 08 May 2013 at 07:05AM.  Routine Hem:  25-Mar-15 06:47   WBC (CBC)  14.0  RBC (CBC)  2.98  Hemoglobin (CBC)  9.7  Hematocrit (CBC)  29.9  Platelet Count (CBC) 191  MCV 100  MCH 32.4  MCHC 32.3  RDW  16.6  Neutrophil % 93.9  Lymphocyte % 4.6  Monocyte % 1.3  Eosinophil % 0.0  Basophil % 0.2  Neutrophil #  13.2  Lymphocyte #  0.6  Monocyte # 0.2  Eosinophil # 0.0  Basophil # 0.0 (Result(s) reported on 08 May 2013 at 07:05AM.)   PERTINENT RADIOLOGY STUDIES: LabUnknown:  24-Mar-15 21:13, CT Head Without Contrast  PACS Image     24-Mar-15 22:38, CT [facility] Chest with for PE  PACS Image   CT:    24-Mar-15 21:13, CT Head Without Contrast  CT Head Without Contrast   REASON FOR EXAM:    seizure, new onset, hx of throat cancer  COMMENTS:       PROCEDURE: CT  - CT HEAD WITHOUT CONTRAST  - May 07 2013  9:13PM     CLINICAL DATA:  Chest pain. Abdominal pain and vomiting. Dizziness.    EXAM:  CT HEAD WITHOUT CONTRAST    TECHNIQUE:  Contiguous axial images were obtained from the base of the skull  through the vertex without intravenous contrast.    COMPARISON:  No  priors.  FINDINGS:  Multiple areas of low attenuation noted throughout the cerebral  hemispheres bilaterally. Some of these are subcortical, while others  are within the deep white matter. The overall appearance of these  lesions is suggestive of vasogenic edema, and highly suspicious for  multifocal metastases to the brain. The largest of these lesions is  in the high right posterior frontal region (image 23 of series 2).  No definite signs of acute/subacute cerebral ischemia. No evidence  of acute intracranial hemorrhage. No evidence of herniation or  significant mass effect at this time. No hydrocephalus. No acute  displaced skull fractures. Visualized paranasal sinuses and mastoids  are generally well pneumatized, with exception of a small air-fluid  level lying dependently in the left sphenoid sinus, and a small  mucosal retention cyst or polyp in the left maxillary sinus.   IMPRESSION:  1. The appearance of the brain is highly suspicious for multifocal  cerebral metastases. Further evaluation with brain MRI with and  without IV gadolinium is recommended.  2. Mild paranasal sinus disease, as above.  These results were called by telephone at the time of interpretation  on 05/07/2013 at 9:44 PM to Dr. Lisa Roca, who verbally  acknowledged these results.      Electronically Signed    By: Vinnie Langton M.D.    On: 05/07/2013 21:45      Verified By: Etheleen Mayhew, M.D.,   Pertinent Past History:  Pertinent Past History PAST MEDICAL HISTORY: 1.  Esophageal metastatic esophageal cancer.  2.  Hepatitis C.  3.  Asthma.  4.  Depression.  5.  Substance abuse.  6.  History of alcohol abuse and smoking.   Hospital Course:  Hospital Course 1. lung cancer and espohgeal cancer with mets to brain poor prognosis pt agreeable to hospice to be d/ischarged to hospice facilty appreciate palliative care consult  2. seizure; from brain mets cont keppra 3. malnutirion severe  protein calorie refuses to use PEG for feeds   Condition on Discharge Stable   Code Status:  Code Status Full Code   DISCHARGE INSTRUCTIONS HOME MEDS:  Medication Reconciliation: Patient's Home Medications at Discharge:     Medication Instructions  gabapentin 100 mg oral capsule  2 cap(s) orally 3 times a day   percocet 5/325 325 mg-5 mg oral tablet  1 tablet orally every 6 hours as needed for pain   levetiracetam  500 milligram(s)  every 12 hours   jevity 1.5 cal bolus feed  140 milliliter(s)     dexamethasone 4 mg oral tablet  1 tab(s) orally once a day   lorazepam 0.5 mg oral tablet  1-2 tab(s) orally/sublingual every 2 to 4 hours as needed for agitation/anxiety  morphine 20 mg/ml oral concentrate  milliliter(s) orally every 1 to 2 hours, As Needed   zofran odt 4 mg oral tablet, disintegrating  1 tab(s) orally every 6 hours, As Needed- for Nausea, Vomiting     STOP TAKING THE FOLLOWING MEDICATION(S):    promethazine 25 mg oral tablet: 1 tab(s) orally every 6 hours, As Needed - for Nausea, Vomiting esomeprazole 20 mg oral powder for reconstitution, delayed release: 1 each orally once a day via PEG tube ambien 10 mg oral tablet: 1 tablet orally once a day (at bedtime) as needed for sleeplessness  Physician's Instructions:  Home Health? No   Home Oxygen? No   Diet Regular   Diet Consistency Regular Consistency   Diet Special Instructions PEG feeding as tolerated   Activity Limitations As tolerated   Referrals HOSPICE   Return to Work Not Applicable   Time frame for Follow Up Appointment 1-2 days  hospice facility   Other Comments GOING TO HOSPICE FACILTY   Electronic Signatures: Bettey Costa (MD)  (Signed 25-Mar-15 13:21)  Authored: ADMISSION DATE AND DIAGNOSIS, CHIEF COMPLAINT/HPI, Allergies, PERTINENT LABS, PERTINENT RADIOLOGY STUDIES, PERTINENT PAST HISTORY, HOSPITAL COURSE, DISCHARGE INSTRUCTIONS HOME MEDS, PATIENT INSTRUCTIONS   Last Updated: 25-Mar-15  13:21 by Bettey Costa (MD)

## 2014-06-07 NOTE — Consult Note (Signed)
ONCOLOGY note - came to see patient but he has already been discharged to hospice home.  Electronic Signatures: Izola PricePandit, Caral Whan Raj (MD)  (Signed on 25-Mar-15 13:22)  Authored  Last Updated: 25-Mar-15 13:22 by Izola PricePandit, Yoanna Jurczyk Raj (MD)

## 2014-07-05 IMAGING — CR DG CHEST 2V
1 series · 2 of 2 positions shown · non-contrast
Comparison: none

REASON FOR EXAM: food getting stuck at base of neck
COMMENTS:

PROCEDURE:     DXR - DXR CHEST PA (OR AP) AND LATERAL  - May 29, 2012  [DATE]
RESULT:     Comparison: 03/22/2012, 02/21/2012

[Series 1: w chest pa · 0.14mm/px · 2 of 2 slices shown]
[im 1/2]
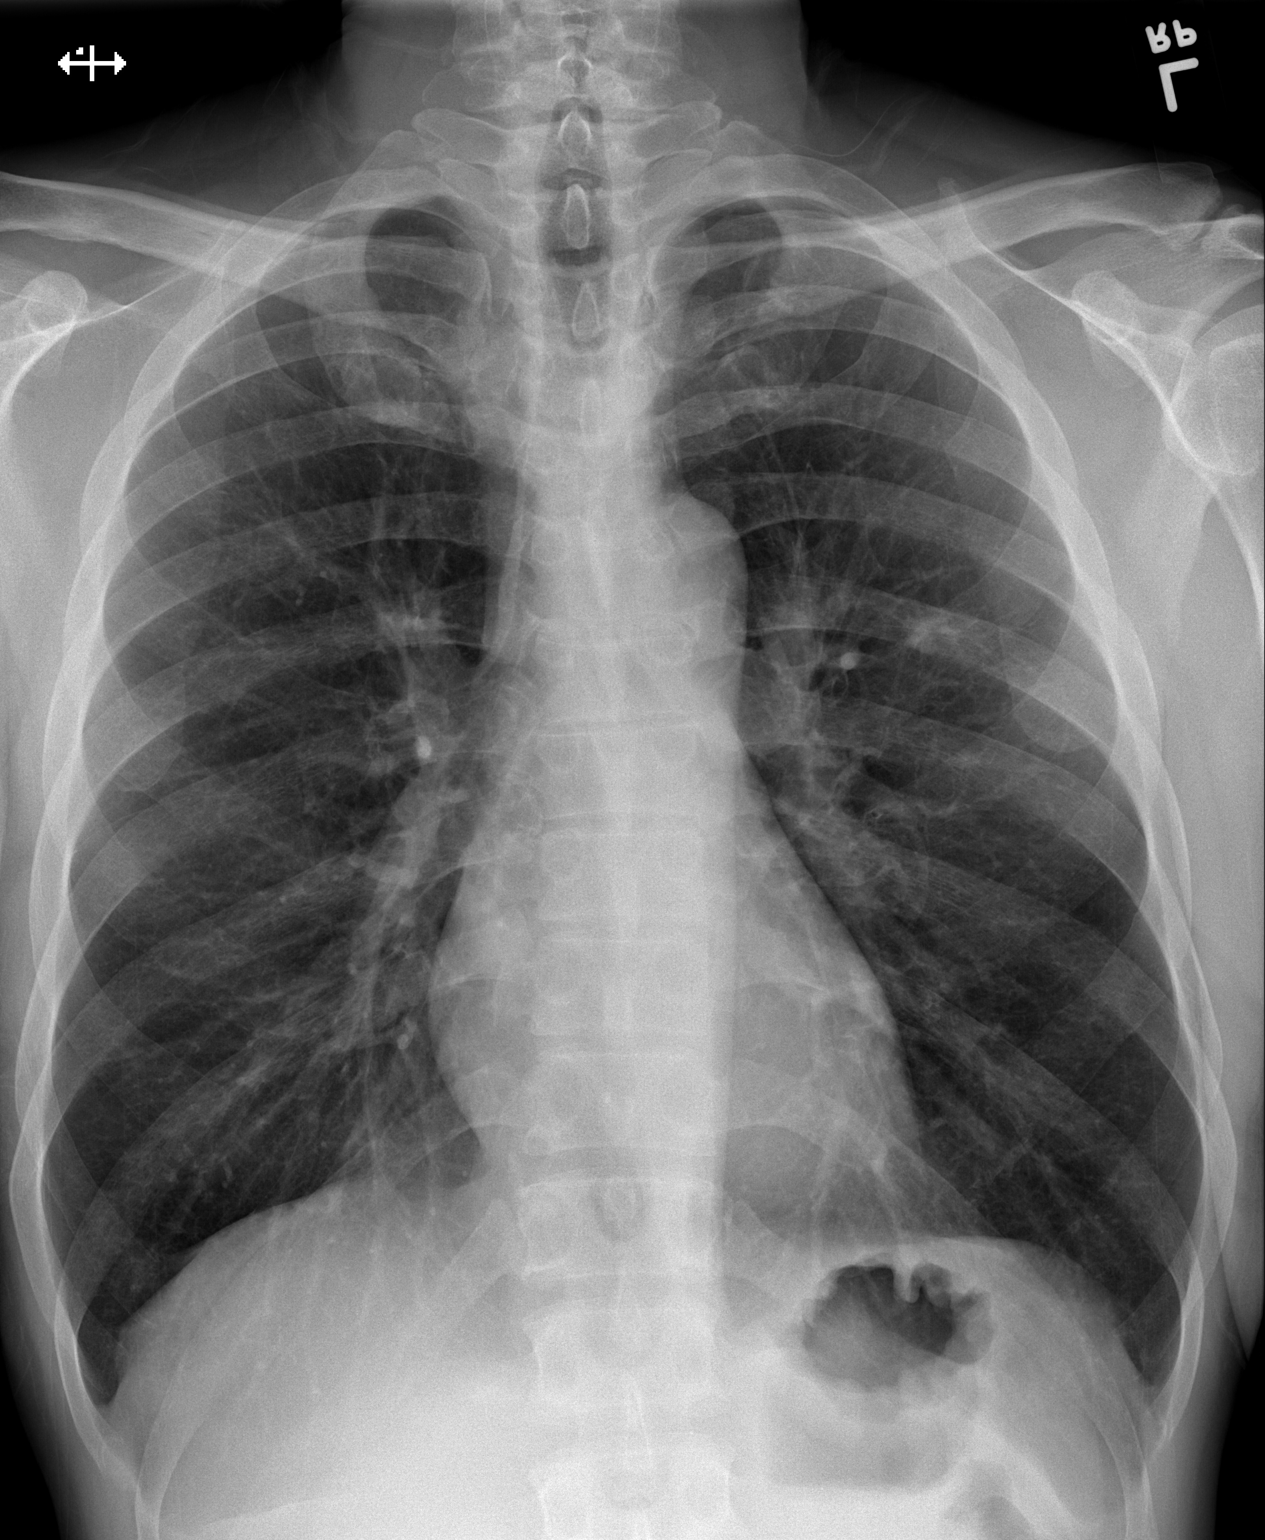
[im 2/2]
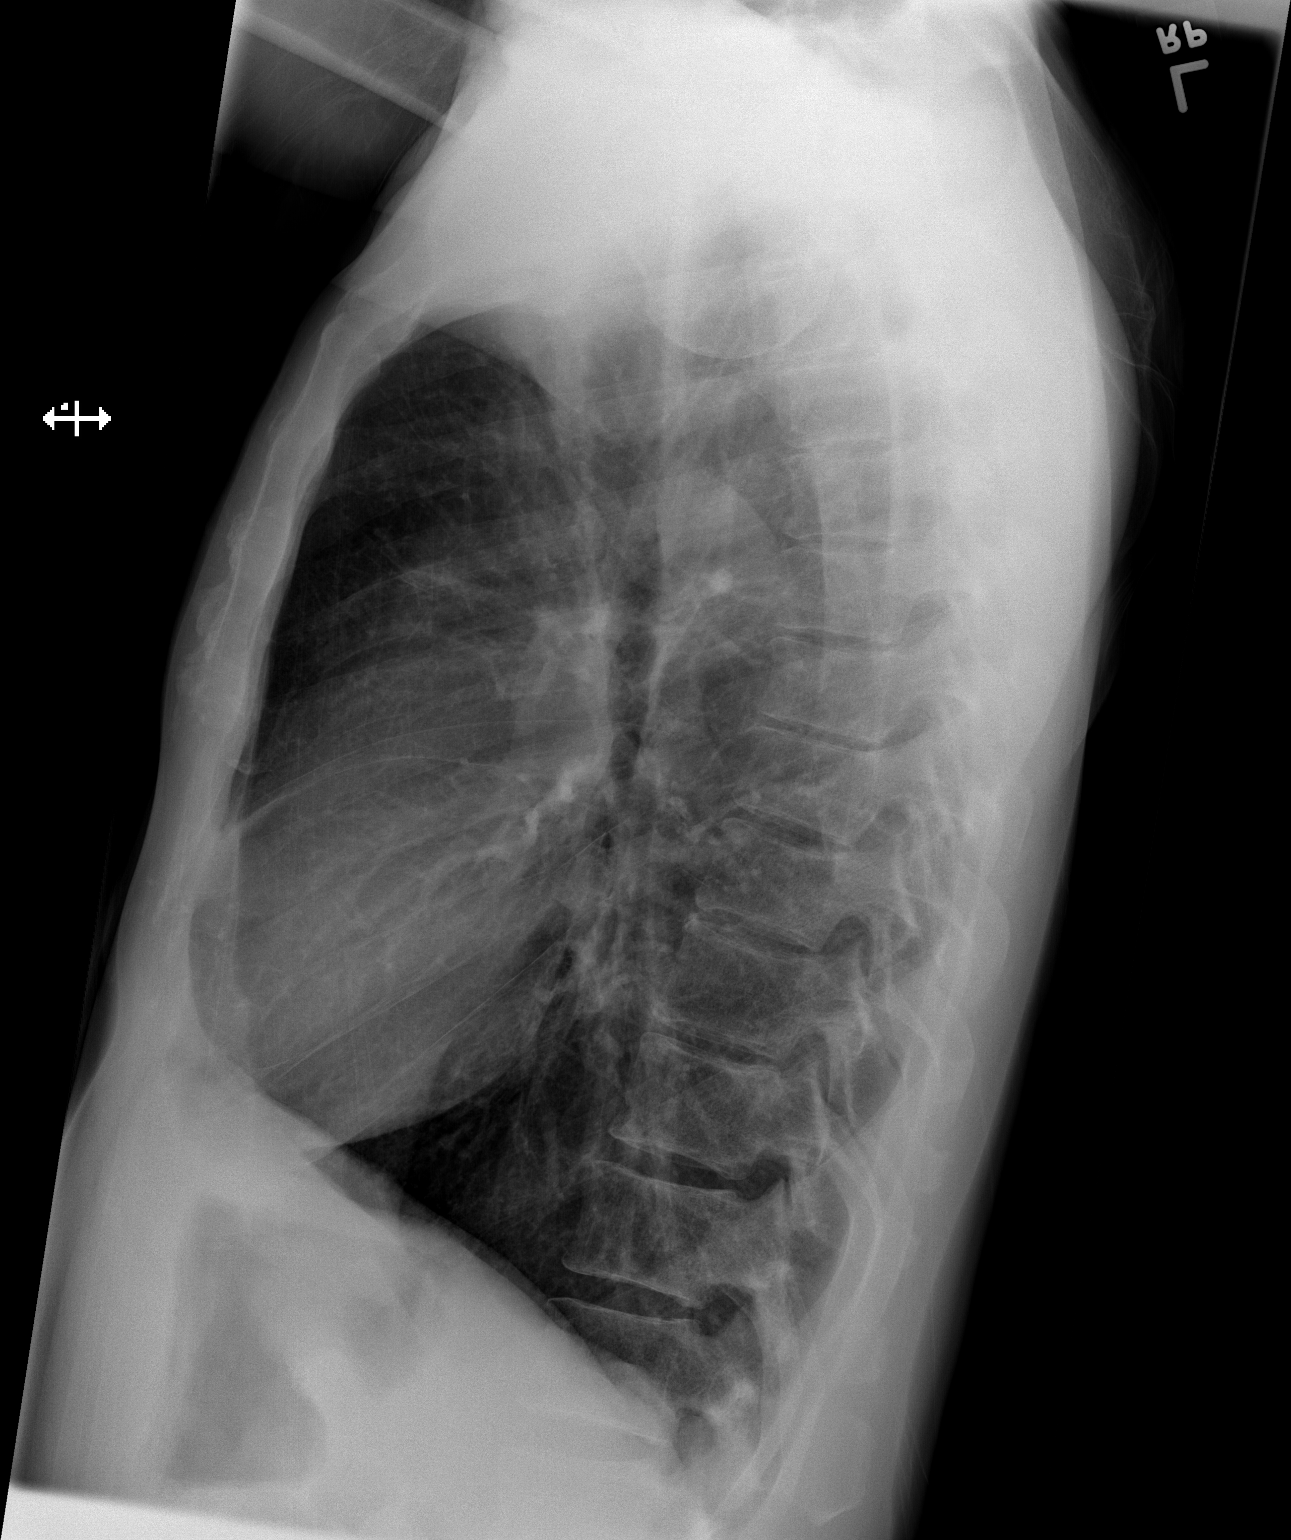

[2 of 2 positions shown; findings below may reference images not displayed]

FINDINGS: The heart is normal in size. Mild prominence of the right superior
mediastinum may be related to patient positioning. There is a small
irregular nodular density overlying left midlung which is similar to recent
prior studies. The right lung is clear.
IMPRESSION: 1. Small irregular nodular density in the left midlung is similar to recent
prior studies. Further evaluation with noncontrast chest CT is recommended.
2. Mild prominence of the right superior mediastinum may be secondary to
patient positioning. Recommend attention on the followup chest CT.

This was called to Dr. Mahdhaoui Aissa at 1103 hours 05/29/2012.

[REDACTED]

## 2014-08-17 IMAGING — RF DG BARIUM SWALLOW
9 series · 14 of 24 positions shown · non-contrast
Comparison: none

REASON FOR EXAM: Dysphagia  esophageal Ca
COMMENTS:

[Series 1: fluoro_barium 2fps_bw · 0.17mm/px · 2 of 40 frames shown (1 of 9)]
[frame 7/40]
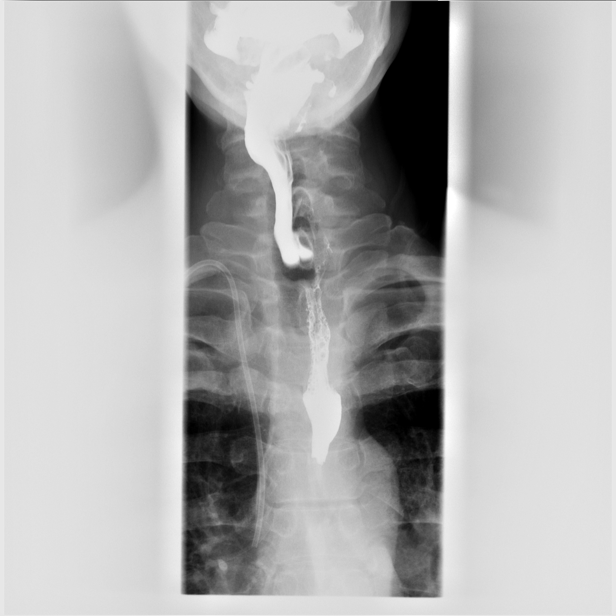
[frame 35/40]
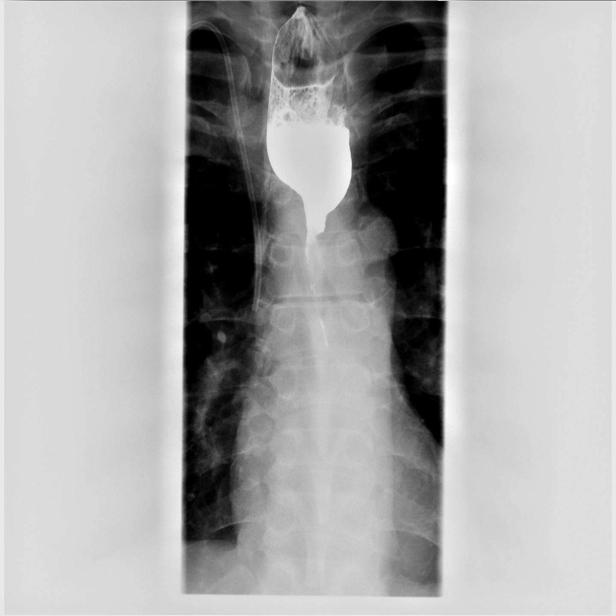

[Series 2: fluoro_barium 2fps_bw · 0.17mm/px · 1 of 29 frames shown (2 of 9)]
[frame 12/29]
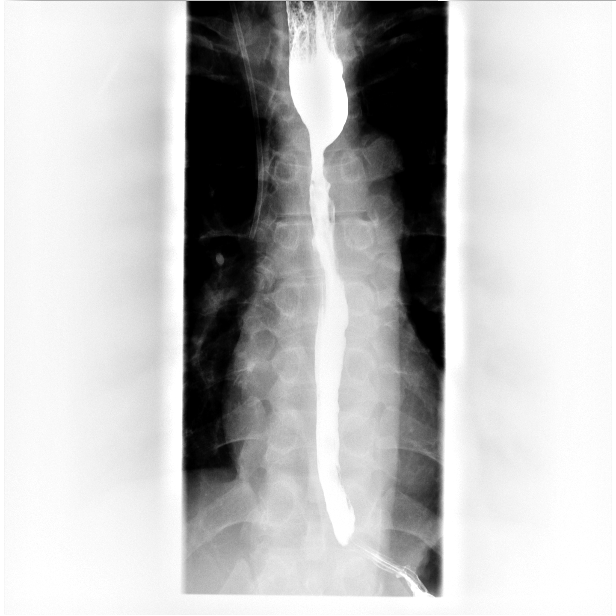

[Series 3: fluoro_barium 2fps_bw · 0.17mm/px · 2 of 9 frames shown (3 of 9)]
[frame 2/9]
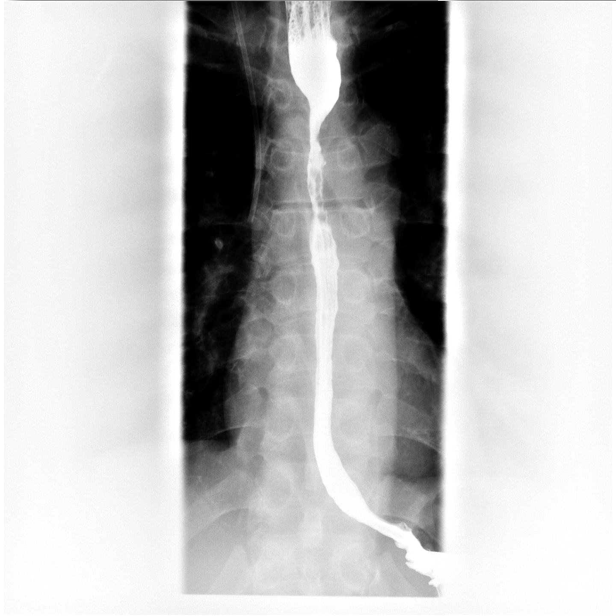
[frame 4/9]
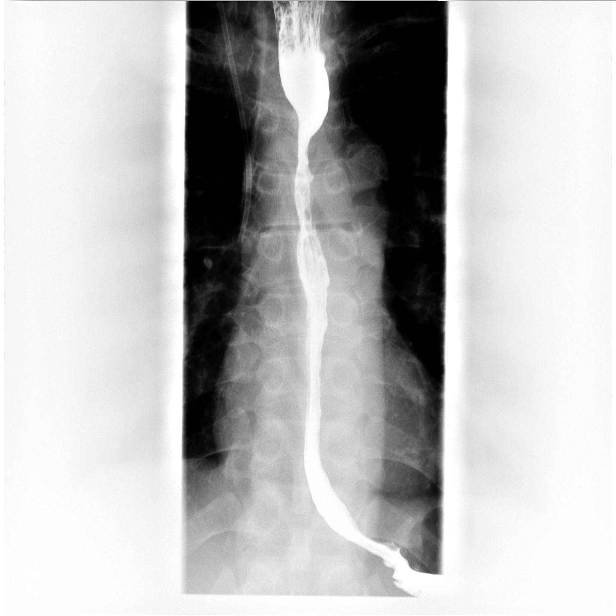

[Series 4: fluoro_barium 2fps_bw · 0.17mm/px · 2 of 40 frames shown (4 of 9)]
[frame 7/40]
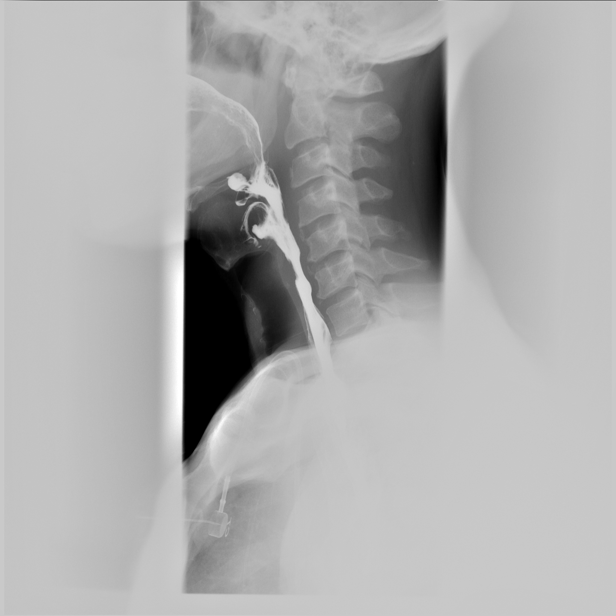
[frame 31/40]
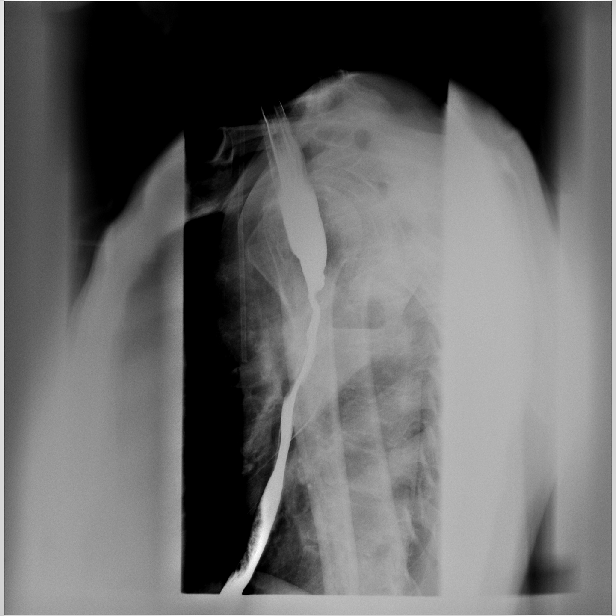

[Series 5: fluoro_barium 2fps_bw · 0.17mm/px · 2 of 3 frames shown (5 of 9)]
[frame 1/3]
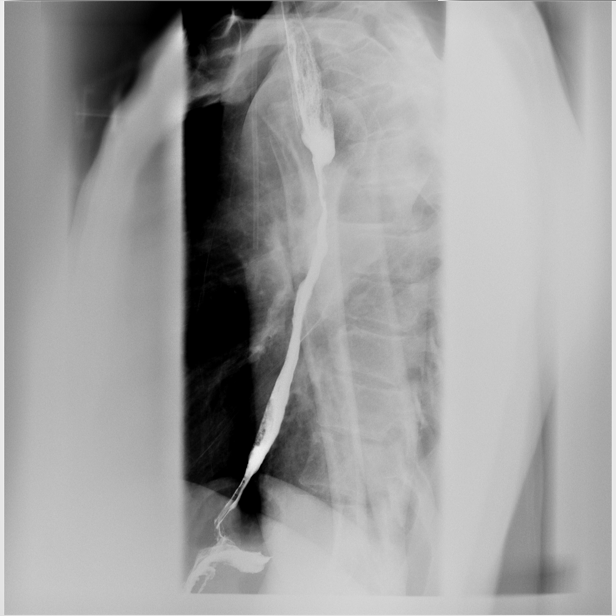
[frame 3/3]
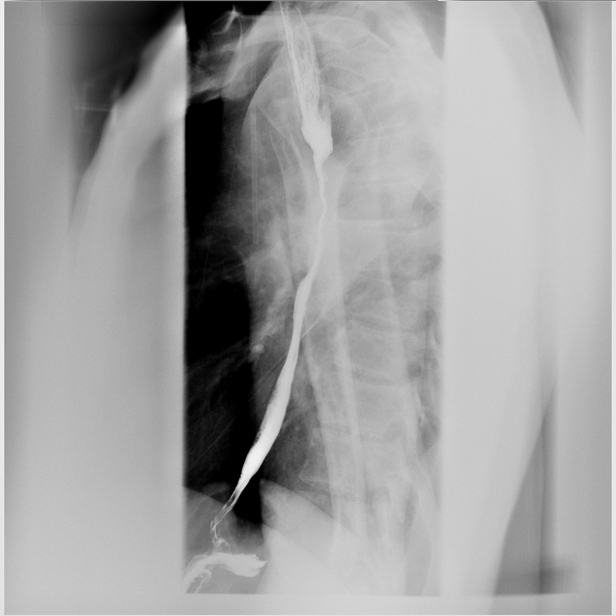

[Series 6: fluoro_barium 2fps_bw · 0.17mm/px · 1 of 40 frames shown (6 of 9)]
[frame 21/40]
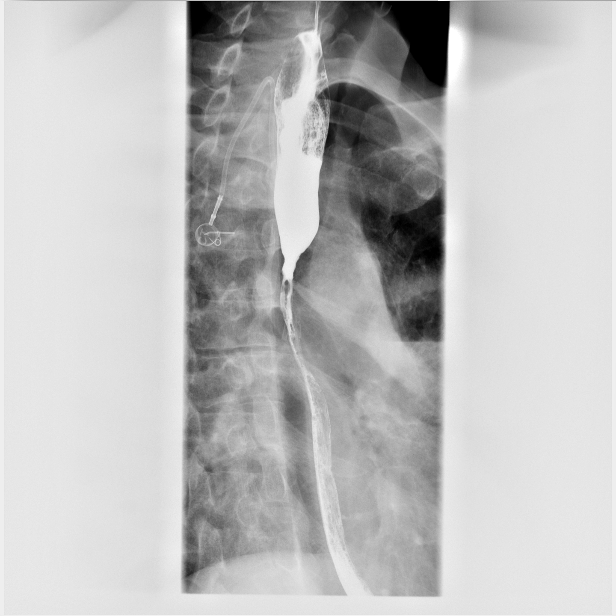

[Series 7: fluoro_barium 2fps_bw · 0.17mm/px · 2 of 20 frames shown (7 of 9)]
[frame 4/20]
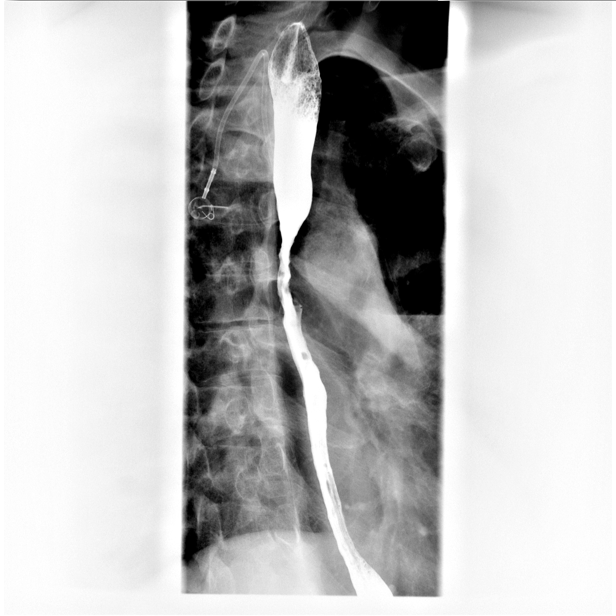
[frame 11/20]
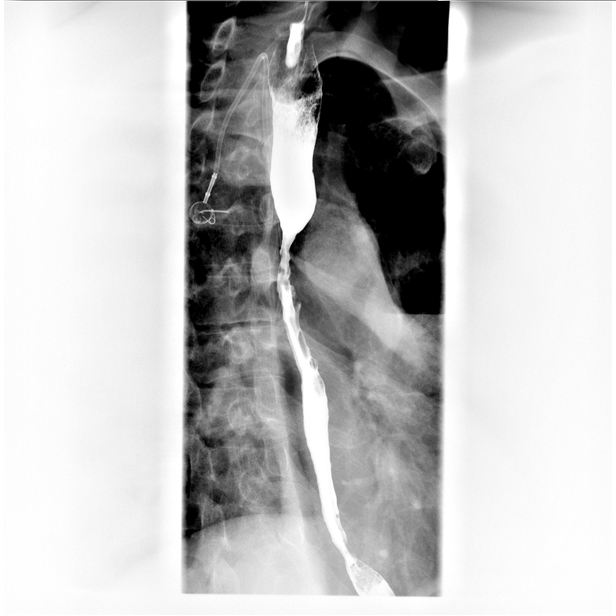

[Series 8: fluoro_barium 2fps_bw · 0.17mm/px · 1 of 3 frames shown (8 of 9)]
[frame 1/3]
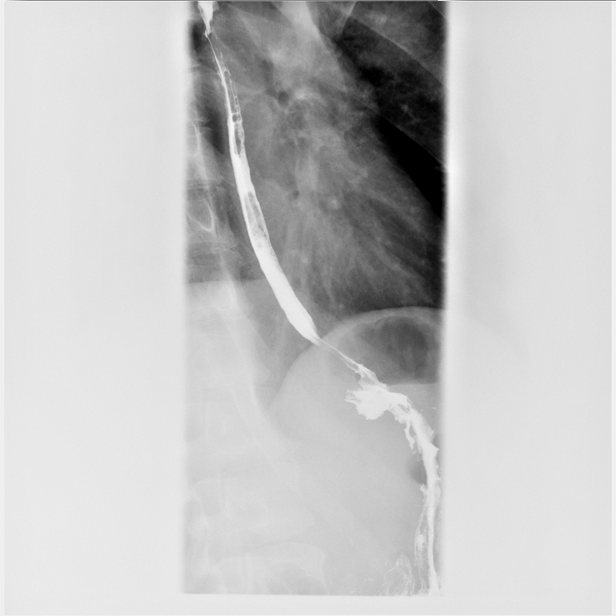

[Series 9: fluoro_barium 2fps_bw · 0.18mm/px · 1 of 1 slices shown (9 of 9)]
[im 1/1]
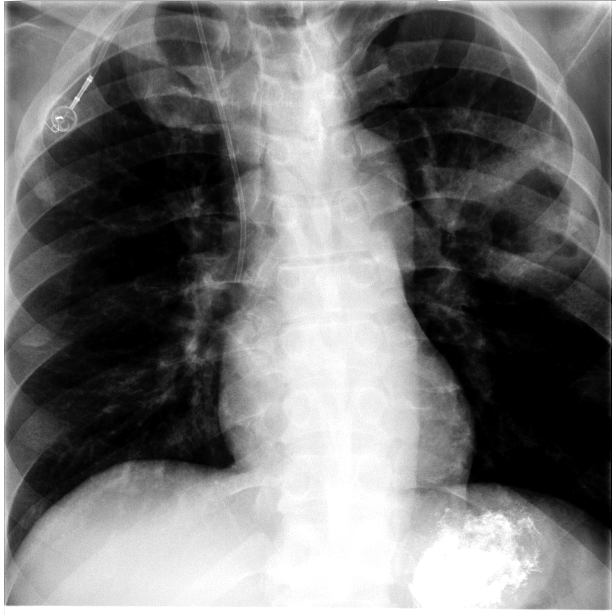

[14 of 24 positions shown; findings below may reference images not displayed]

PROCEDURE:     FL  - FL BARIUM SWALLOW  - July 11, 2012 [DATE]

RESULT:     Limited air swallow examination is performed. The patient
ingested a liquid barium. Initially there was no passage of area through the
midesophagus level near the aortic arch. There is proximal mild dilation
with a significant degree of narrowing in the mid esophagus. It is difficult
to determine if the distal esophagus is completely normal given the small
amount of barium which could pass through the area of 9 dilated persistent
narrowing. No gastroesophageal reflux was observed. A 12.5 mm
barium-impregnated tablet was not administered. There was no aspiration of
barium but there was some penetration of the upper airway and retention of
barium in the vallecula region. The patient did have some spontaneous
coughing. There is some retention of barium in the proximal esophagus
proximal to the area of narrowing.
IMPRESSION: 1. Fairly high degree of narrowing and stenosis in the mid esophagus near
the level of the aortic arch which could be secondary to underlying
malignancy or therapy including post radiation changes. There is retention
of barium proximally. There is penetration of barium into the upper airway
without frank aspiration. There was a spontaneous cough. The high degree of
narrowing may require dietary modifications. It is uncertain if there is
residual malignancy in the region. Followup PET CT may be beneficial. The
patient had a recent PET CT on 06/01/2012. The area of narrowing seen on
today's exam appears to correlate with the proximal edge of the area of
abnormal localization in the esophagus on the PET.

[REDACTED]

## 2014-09-20 IMAGING — CR DG CHEST 2V
1 series · 2 of 2 positions shown · non-contrast
Comparison: none

REASON FOR EXAM: for f/u left pneumonia
COMMENTS:

[Series 1: w chest pa · 0.14mm/px · 2 of 2 slices shown]
[im 1/2]
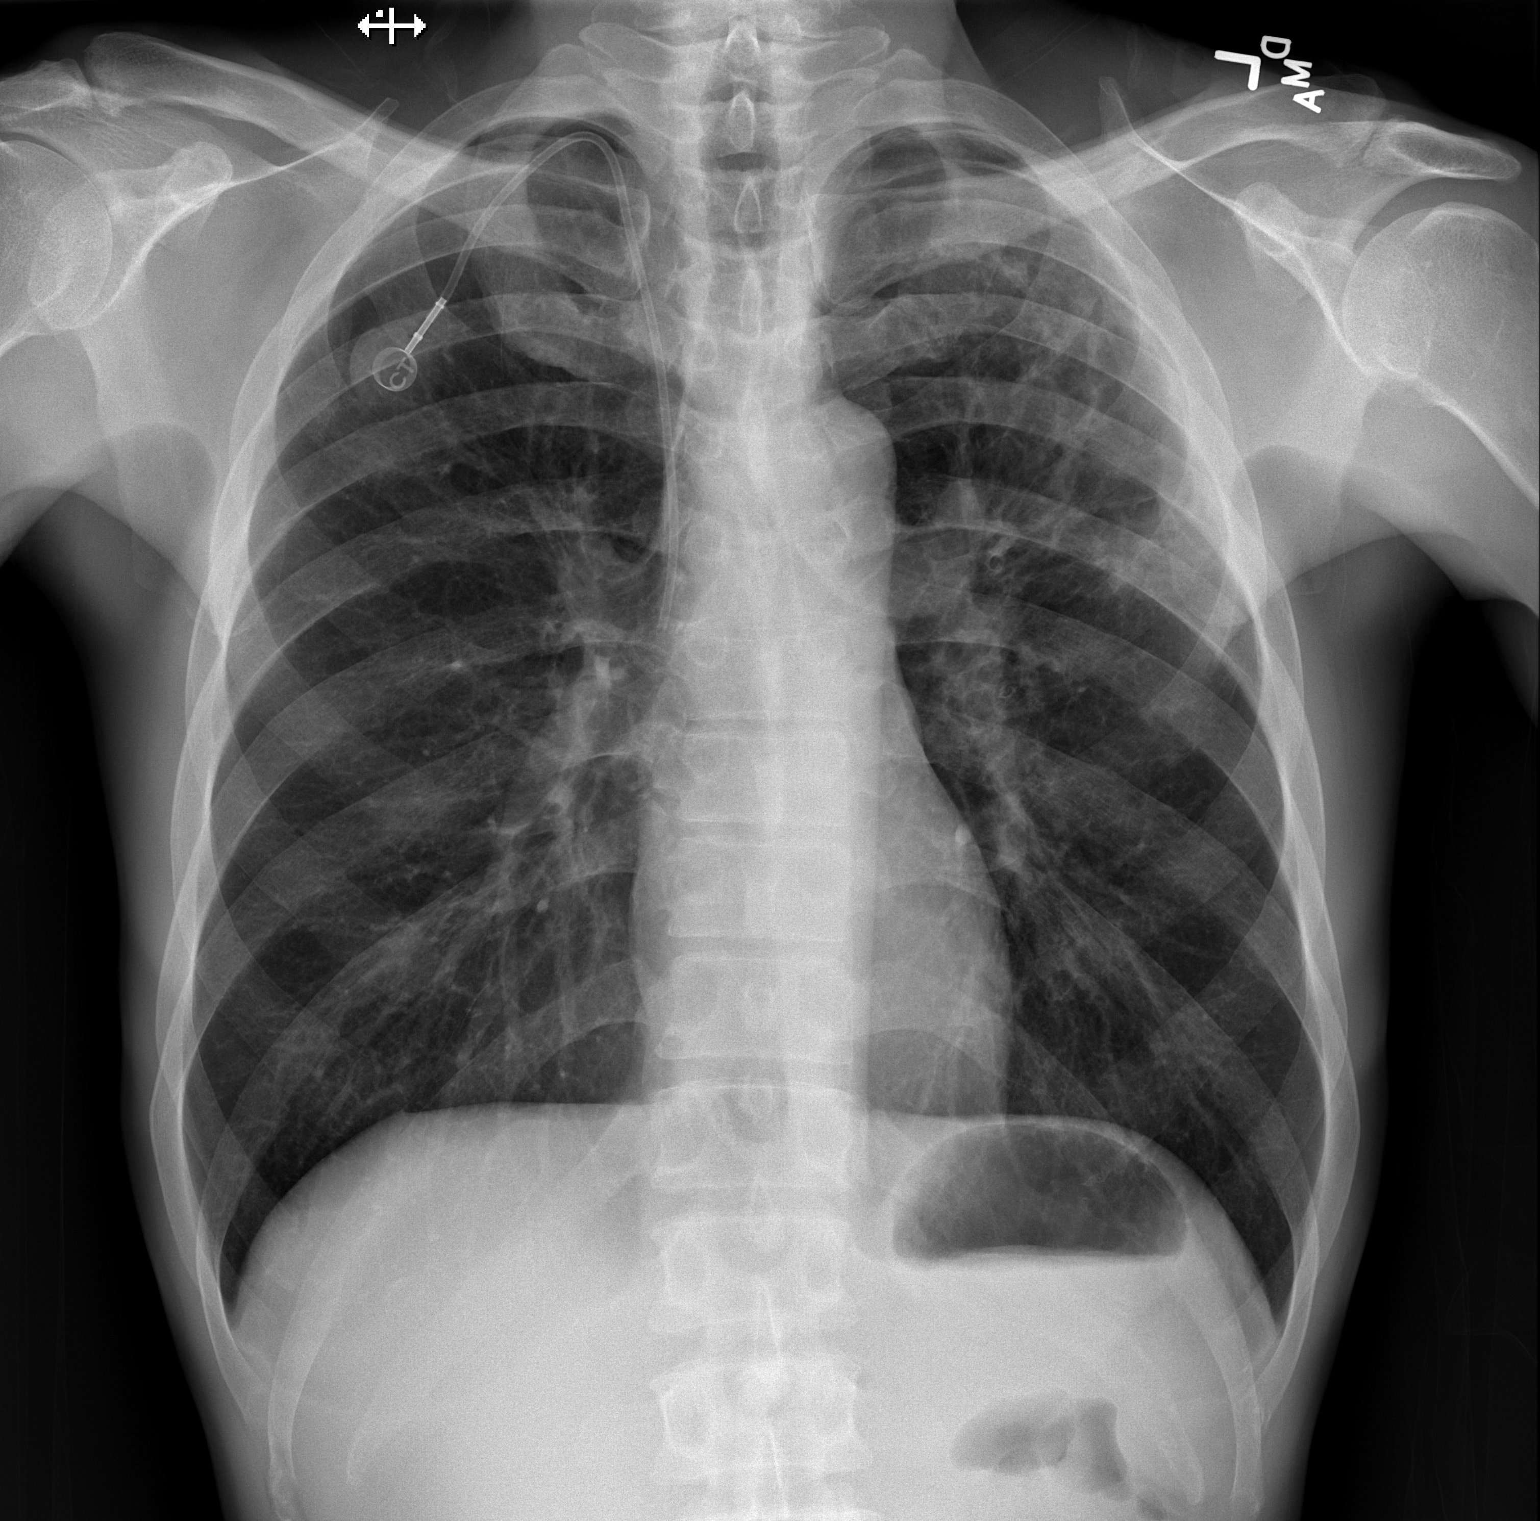
[im 2/2]
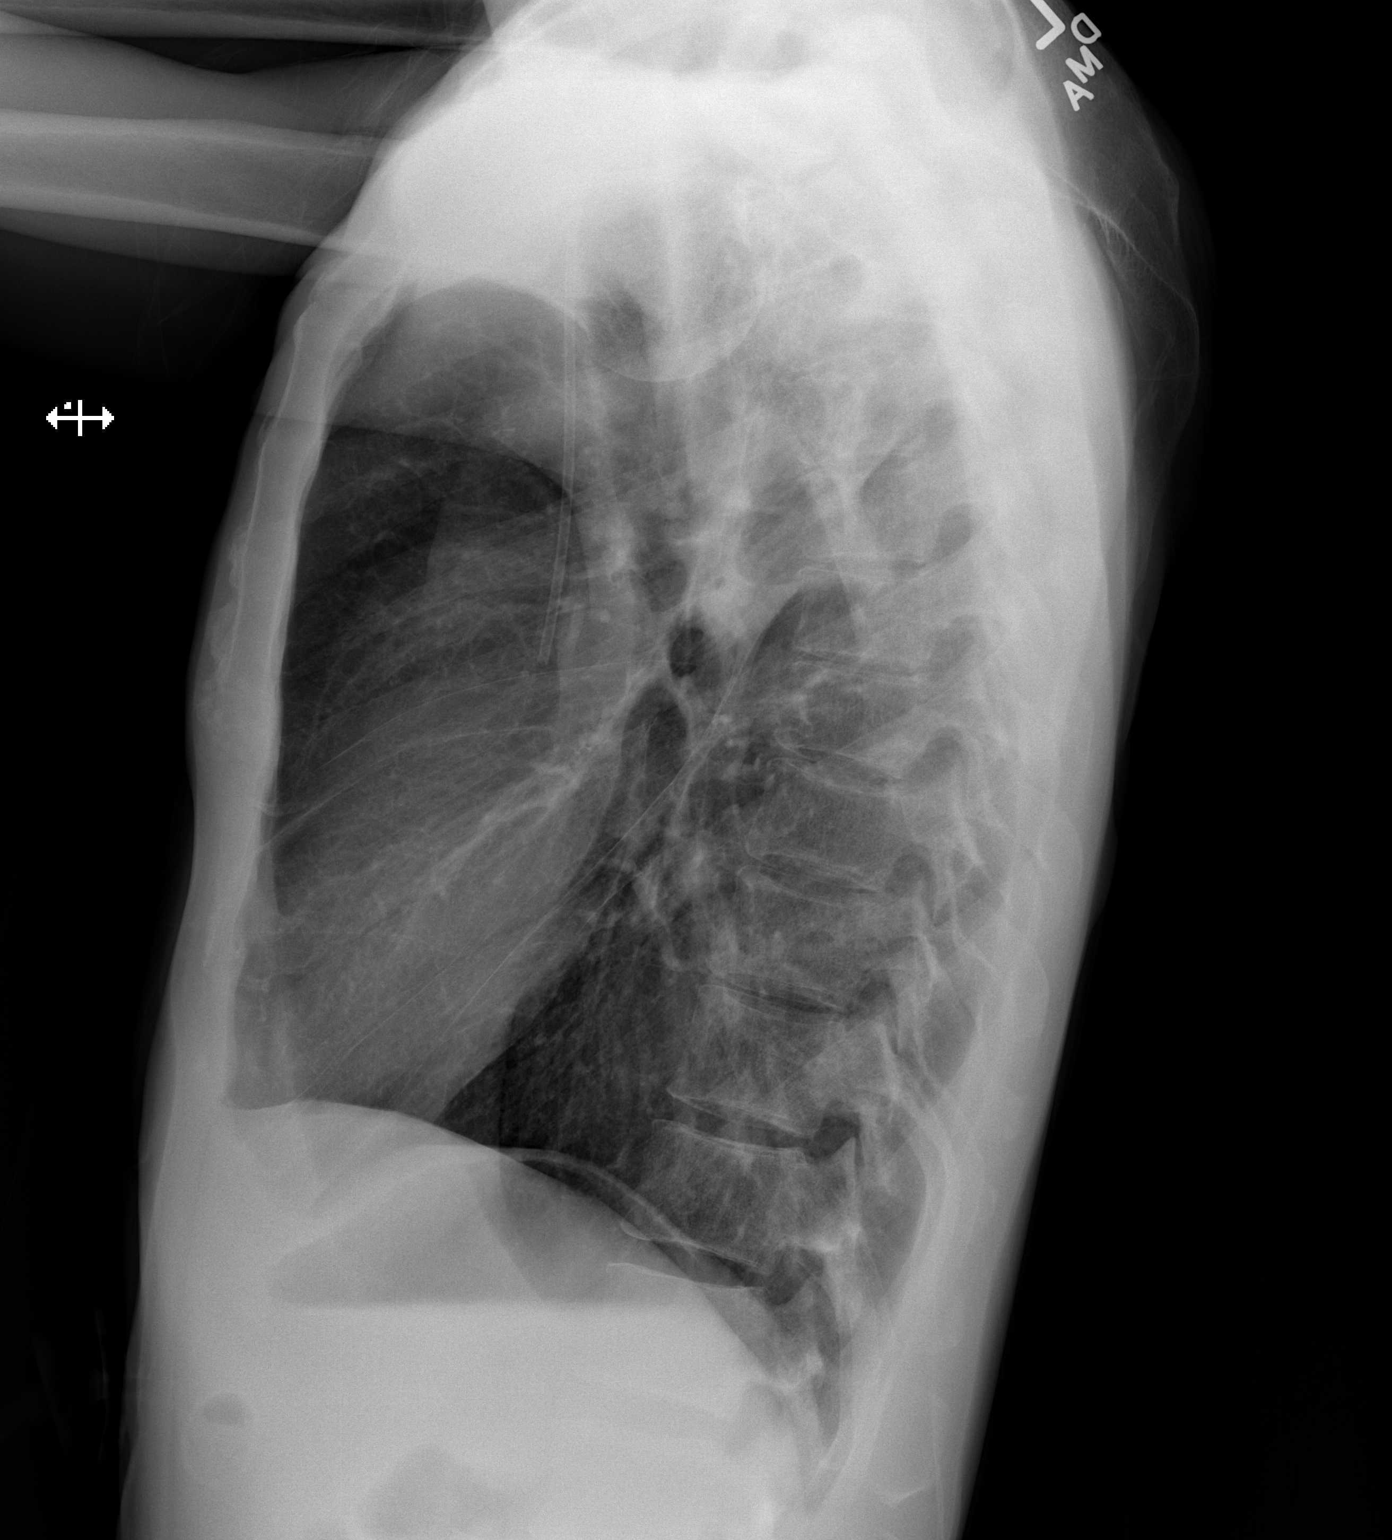

[2 of 2 positions shown; findings below may reference images not displayed]

PROCEDURE:     DXR - DXR CHEST PA (OR AP) AND LATERAL  - August 14, 2012 [DATE]

RESULT:     Comparison is made to a study July 10, 2012.

The cavitary lesion in the left upper hemithorax is much less conspicuous
today. No air-fluid level is evident. There remain coarse increased lung
markings in the left upper lobe. The left lower lobe in the right lung are
clear and well-expanded. The cardiac silhouette is normal in size. A
Port-A-Cath appliance is in place with the tip the catheter in the region of
the midportion of the SVC.
IMPRESSION: There has been considerable improvement in the appearance
of the left upper lobe. The cavitary lesion is much less conspicuous today.
An additional followup film in 3 to 4 weeks is recommended following any
additional therapy to assure ongoing resolution.

[REDACTED]

## 2022-06-29 ENCOUNTER — Other Ambulatory Visit: Payer: Self-pay
